# Patient Record
Sex: Female | Born: 1990
Health system: Southern US, Community
[De-identification: ages and names within clinical notes are randomized; demographics above are authoritative.]

## PROBLEM LIST (undated history)

## (undated) DIAGNOSIS — R569 Unspecified convulsions: Secondary | ICD-10-CM

## (undated) DIAGNOSIS — N83209 Unspecified ovarian cyst, unspecified side: Secondary | ICD-10-CM

## (undated) DIAGNOSIS — F1910 Other psychoactive substance abuse, uncomplicated: Secondary | ICD-10-CM

## (undated) HISTORY — PX: FEMUR FRACTURE SURGERY: SHX633

## (undated) HISTORY — PX: OVARIAN CYST SURGERY: SHX726

## (undated) HISTORY — PX: INNER EAR SURGERY: SHX679

## (undated) HISTORY — PX: DILATION AND CURETTAGE OF UTERUS: SHX78

## (undated) HISTORY — PX: APPENDECTOMY: SHX54

---

## 2012-10-08 ENCOUNTER — Emergency Department: Payer: Self-pay | Admitting: Emergency Medicine

## 2012-11-12 ENCOUNTER — Emergency Department: Payer: Self-pay | Admitting: Emergency Medicine

## 2012-11-30 ENCOUNTER — Emergency Department: Payer: Self-pay | Admitting: Emergency Medicine

## 2012-11-30 LAB — COMPREHENSIVE METABOLIC PANEL
Anion Gap: 7 (ref 7–16)
BUN: 3 mg/dL — ABNORMAL LOW (ref 7–18)
Bilirubin,Total: 0.5 mg/dL (ref 0.2–1.0)
Chloride: 103 mmol/L (ref 98–107)
Potassium: 4 mmol/L (ref 3.5–5.1)
SGPT (ALT): 22 U/L (ref 12–78)
Sodium: 138 mmol/L (ref 136–145)
Total Protein: 6.9 g/dL (ref 6.4–8.2)

## 2012-11-30 LAB — CBC
HCT: 43 % (ref 35.0–47.0)
MCH: 29.7 pg (ref 26.0–34.0)
MCHC: 33.7 g/dL (ref 32.0–36.0)
MCV: 88 fL (ref 80–100)
RBC: 4.87 10*6/uL (ref 3.80–5.20)
RDW: 12.9 % (ref 11.5–14.5)
WBC: 6.1 10*3/uL (ref 3.6–11.0)

## 2012-11-30 LAB — URINALYSIS, COMPLETE
Bilirubin,UR: NEGATIVE
Glucose,UR: NEGATIVE mg/dL (ref 0–75)
Ketone: NEGATIVE
Leukocyte Esterase: NEGATIVE
Nitrite: NEGATIVE
Protein: NEGATIVE

## 2012-12-02 LAB — BETA STREP CULTURE(ARMC)

## 2014-11-02 ENCOUNTER — Encounter (HOSPITAL_BASED_OUTPATIENT_CLINIC_OR_DEPARTMENT_OTHER): Payer: Self-pay | Admitting: *Deleted

## 2014-11-02 ENCOUNTER — Emergency Department (HOSPITAL_BASED_OUTPATIENT_CLINIC_OR_DEPARTMENT_OTHER)
Admission: EM | Admit: 2014-11-02 | Discharge: 2014-11-03 | Disposition: A | Discharge status: Home / Self Care | Payer: Self-pay | Attending: Emergency Medicine | Admitting: Emergency Medicine

## 2014-11-02 DIAGNOSIS — Z720 Tobacco use: Secondary | ICD-10-CM | POA: Insufficient documentation

## 2014-11-02 DIAGNOSIS — K029 Dental caries, unspecified: Secondary | ICD-10-CM | POA: Insufficient documentation

## 2014-11-02 MED ORDER — AMOXICILLIN 500 MG PO CAPS
500.0000 mg | ORAL_CAPSULE | Freq: Once | ORAL | Status: AC
  Administered 2014-11-03: 500 mg via ORAL
  Filled 2014-11-02: qty 1

## 2014-11-02 MED ORDER — AMOXICILLIN 500 MG PO CAPS: 500.0000 mg | ORAL_CAPSULE | Freq: Three times a day (TID) | ORAL | Status: DC

## 2014-11-02 MED ORDER — BUPIVACAINE-EPINEPHRINE (PF) 0.5% -1:200000 IJ SOLN
1.8000 mL | Freq: Once | INTRAMUSCULAR | Status: AC
  Administered 2014-11-02: 1.8 mL
  Filled 2014-11-02: qty 1.8

## 2014-11-02 MED ORDER — HYDROCODONE-ACETAMINOPHEN 5-325 MG PO TABS: ORAL_TABLET | ORAL | Status: DC

## 2014-11-02 NOTE — ED Provider Notes (Signed)
CSN: [id number]     Arrival date & time 11/02/14  2213 History   First MD Initiated Contact with Patient 11/02/14 2330     Chief Complaint  Patient presents with  . Dental Pain     (Consider location/radiation/quality/duration/timing/severity/associated sxs/prior Treatment) HPI  Caroline Cohen is a 24 y.o. female complaining of severe right lower jaw tooth pain onset 3 days ago she's been taking Motrin at home with no relief.Denies fever/chills, difficulty opening jaw, difficulty swallowing, SOB, gum swelling, facial swelling, neck swelling.    History reviewed. No pertinent past medical history. Past Surgical History  Procedure Laterality Date  . Appendectomy    . Inner ear surgery    . Femur fracture surgery     History reviewed. No pertinent family history. History  Substance Use Topics  . Smoking status: Current Every Day Smoker -- 0.50 packs/day    Types: Cigarettes  . Smokeless tobacco: Not on file  . Alcohol Use: No   OB History    No data available     Review of Systems  10 systems reviewed and found to be negative, except as noted in the HPI.   Allergies  Review of patient's allergies indicates no known allergies.  Home Medications   Prior to Admission medications   Medication Sig Start Date End Date Taking? Authorizing Provider  amoxicillin (AMOXIL) 500 MG capsule Take 1 capsule (500 mg total) by mouth 3 (three) times daily. 11/02/14   Janesia Joswick, PA-C  HYDROcodone-acetaminophen (NORCO/VICODIN) 5-325 MG per tablet Take 1-2 tablets by mouth every 6 hours as needed for pain. 11/02/14   Citlaly Camplin, PA-C   BP 114/82 mmHg  Pulse 96  Temp(Src) 98.8 F (37.1 C) (Oral)  Resp 18  SpO2 100%  LMP 10/12/2014 Physical Exam  Constitutional: She is oriented to person, place, and time. She appears well-developed and well-nourished. No distress.  HENT:  Head: Normocephalic.  Mouth/Throat:    Generally poor dentition, no gingival swelling, erythema or  tenderness to palpation. Patient is handling their secretions. There is no tenderness to palpation or firmness underneath tongue bilaterally. No trismus.    Eyes: Conjunctivae and EOM are normal.  Cardiovascular: Normal rate, regular rhythm and intact distal pulses.   Pulmonary/Chest: Effort normal and breath sounds normal. No stridor.  Musculoskeletal: Normal range of motion.  Neurological: She is alert and oriented to person, place, and time.  Psychiatric: She has a normal mood and affect.  Nursing note and vitals reviewed.   ED Course  NERVE BLOCK Date/Time: 11/02/2014 11:52 PM Performed by: Wynetta EmeryPISCIOTTA, Yerlin Gasparyan Authorized by: Wynetta EmeryPISCIOTTA, Mouna Yager Consent: Verbal consent obtained. Consent given by: patient Required items: required blood products, implants, devices, and special equipment available Patient identity confirmed: verbally with patient Indications: pain relief Body area: face/mouth Nerve: inferior alveolar Laterality: right Patient sedated: no Preparation: Patient was prepped and draped in the usual sterile fashion. Patient position: sitting Needle gauge: 27 G Anesthetic total: 1.8 ml Outcome: pain improved Patient tolerance: Patient tolerated the procedure well with no immediate complications   (including critical care time) Labs Review Labs Reviewed - No data to display  Imaging Review No results found.   EKG Interpretation None      MDM   Final diagnoses:  Pain due to dental caries    Filed Vitals:   11/02/14 2227  BP: 114/82  Pulse: 96  Temp: 98.8 F (37.1 C)  TempSrc: Oral  Resp: 18  SpO2: 100%    Medications  amoxicillin (AMOXIL) capsule 500  mg (not administered)  bupivacaine-epinephrine (MARCAINE W/ EPI) 0.5% -1:200000 injection 1.8 mL (1.8 mLs Infiltration Given by Other 11/02/14 2344)    [name] is a pleasant 24 y.o. female presenting with dental pain associated with dental caries but no signs or symptoms of dental abscess.  Patient afebrile, non toxic appearing and swallowing secretions well. I gave patient referral to dentist and stressed the importance of dental follow up for definitive management of dental issues. Patient voices understanding and is agreeable to plan.  Evaluation does not show pathology that would require ongoing emergent intervention or inpatient treatment. Pt is hemodynamically stable and mentating appropriately. Discussed findings and plan with patient/guardian, who agrees with care plan. All questions answered. Return precautions discussed and outpatient follow up given.   New Prescriptions   AMOXICILLIN (AMOXIL) 500 MG CAPSULE    Take 1 capsule (500 mg total) by mouth 3 (three) times daily.   HYDROCODONE-ACETAMINOPHEN (NORCO/VICODIN) 5-325 MG PER TABLET    Take 1-2 tablets by mouth every 6 hours as needed for pain.         Wynetta Emery, PA-C 11/02/14 2353  Vida Roller, MD 11/04/14 [phone]

## 2014-11-02 NOTE — Discharge Instructions (Signed)
Take percocet for breakthrough pain, do not drink alcohol, drive, care for children or do other critical tasks while taking percocet. ° °Return to the emergency room for fever, change in vision, redness to the face that rapidly spreads towards the eye, nausea or vomiting, difficulty swallowing or shortness of breath. °  °Apply warm compresses to jaw throughout the day.  ° ° °Take your antibiotics as directed and to the end of the course. DO NOT drink alcohol when taking metronidazole, it will make you very sick!  ° °Followup with a dentist is very important for ongoing evaluation and management of recurrent dental pain. Return to emergency department for emergent changing or worsening symptoms." ° °Low-cost dental clinic: °**David  Civils  at [phone]**  °**Janna Civils at [phone] [address]**   ° °You may also call [phone] ° °Dental Assistance °If the dentist on-call cannot see you, please use the resources below: ° ° °Patients with Medicaid: [facility] °[address], [phone] °[address], [phone] ° °If unable to pay, or uninsured, contact [facility] ([phone]) or [facility] ([phone] in [place], [phone] in [place]) to become qualified for the adult dental clinic ° °Other Low-Cost Community Dental Services: °[facility]- [address], [place], [place], [address] °   [phone], Ext. [phone] °   2nd and 4th Thursday of the month at 6:30am °   10 clients each day by appointment, can sometimes see walk-in     patients if someone does not show for an appointment °[facility]- [address], [place], [place], [address] °   [phone] °[facility]- [address], [place], [place], [address] °   [phone] ° °[facility]- [phone] °[facility]- [phone] °[facility]- [phone] ° °

## 2014-11-02 NOTE — ED Notes (Signed)
Pt reports right lower dental pain x 2 days.  Swelling noted.

## 2015-01-09 ENCOUNTER — Encounter (HOSPITAL_COMMUNITY): Payer: Self-pay | Admitting: *Deleted

## 2015-01-09 ENCOUNTER — Emergency Department (HOSPITAL_COMMUNITY)
Admission: EM | Admit: 2015-01-09 | Discharge: 2015-01-10 | Disposition: A | Discharge status: Home / Self Care | Payer: Self-pay | Attending: Emergency Medicine | Admitting: Emergency Medicine

## 2015-01-09 DIAGNOSIS — Z720 Tobacco use: Secondary | ICD-10-CM | POA: Insufficient documentation

## 2015-01-09 DIAGNOSIS — N751 Abscess of Bartholin's gland: Secondary | ICD-10-CM | POA: Insufficient documentation

## 2015-01-09 MED ORDER — DOXYCYCLINE HYCLATE 100 MG PO CAPS: 100.0000 mg | ORAL_CAPSULE | Freq: Two times a day (BID) | ORAL | Status: DC

## 2015-01-09 MED ORDER — LIDOCAINE HCL (PF) 1 % IJ SOLN
5.0000 mL | Freq: Once | INTRAMUSCULAR | Status: AC
Start: 2015-01-09 — End: 2015-01-09
  Administered 2015-01-09: 5 mL
  Filled 2015-01-09: qty 5

## 2015-01-09 MED ORDER — CEFTRIAXONE SODIUM 250 MG IJ SOLR
250.0000 mg | INTRAMUSCULAR | Status: DC
  Administered 2015-01-09: 250 mg via INTRAMUSCULAR
  Filled 2015-01-09: qty 250

## 2015-01-09 MED ORDER — LIDOCAINE HCL (PF) 1 % IJ SOLN
INTRAMUSCULAR | Status: AC
  Administered 2015-01-09: 5 mL
  Filled 2015-01-09: qty 5

## 2015-01-09 MED ORDER — TRAMADOL HCL 50 MG PO TABS
50.0000 mg | ORAL_TABLET | Freq: Four times a day (QID) | ORAL | Status: DC | PRN
Start: 2015-01-09 — End: 2015-04-22

## 2015-01-09 NOTE — ED Notes (Signed)
The pt has a growth in her rectal area    That has been there for 2 weeks.  The pt is calling it a cyst.  lmp  1-2 weeks

## 2015-01-09 NOTE — ED Notes (Addendum)
Pt states she has a "bump" under the skin in her vaginal area with some pain, states she noticed it about a week ago but felt like it was larger last night. Denies abdominal pain and states lmp was 2 weeks ago.

## 2015-01-09 NOTE — Discharge Instructions (Signed)
Bartholin's Cyst or Abscess °Bartholin's glands are small glands located within the folds of skin (labia) along the sides of the lower opening of the vagina (birth canal). A cyst may develop when the duct of the gland becomes blocked. When this happens, fluid that accumulates within the cyst can become infected. This is known as an abscess. The Bartholin gland produces a mucous fluid to lubricate the outside of the vagina during sexual intercourse. °SYMPTOMS  °· Patients with a small cyst may not have any symptoms. °· Mild discomfort to severe pain depending on the size of the cyst and if it is infected (abscess). °· Pain, redness, and swelling around the lower opening of the vagina. °· Painful intercourse. °· Pressure in the perineal area. °· Swelling of the lips of the vagina (labia). °· The cyst or abscess can be on one side or both sides of the vagina. °DIAGNOSIS  °· A large swelling is seen in the lower vagina area by your caregiver. °· Painful to touch. °· Redness and pain, if it is an abscess. °TREATMENT  °· Sometimes the cyst will go away on its own. °· Apply warm wet compresses to the area or take hot sitz baths several times a day. °· An incision to drain the cyst or abscess with local anesthesia. °· Culture the pus, if it is an abscess. °· Antibiotic treatment, if it is an abscess. °· Cut open the gland and suture the edges to make the opening of the gland bigger (marsupialization). °· Remove the whole gland if the cyst or abscess returns. °PREVENTION  °· Practice good hygiene. °· Clean the vaginal area with a mild soap and soft cloth when bathing. °· Do not rub hard in the vaginal area when bathing. °· Protect the crotch area with a padded cushion if you take long bike rides or ride horses. °· Be sure you are well lubricated when you have sexual intercourse. °HOME CARE INSTRUCTIONS  °· If your cyst or abscess was opened, a small piece of gauze, or a drain, may have been placed in the wound to allow  drainage. Do not remove this gauze or drain unless directed by your caregiver. °· Wear feminine pads, not tampons, as needed for any drainage or bleeding. °· If antibiotics were prescribed, take them exactly as directed. Finish the entire course. °· Only take over-the-counter or prescription medicines for pain, discomfort, or fever as directed by your caregiver. °SEEK IMMEDIATE MEDICAL CARE IF:  °· You have an increase in pain, redness, swelling, or drainage. °· You have bleeding from the wound which results in the use of more than the number of pads suggested by your caregiver in 24 hours. °· You have chills. °· You have a fever. °· You develop any new problems (symptoms) or aggravation of your existing condition. °MAKE SURE YOU:  °· Understand these instructions. °· Will watch your condition. °· Will get help right away if you are not doing well or get worse. °Document Released: 10/10/2005 Document Revised: 01/02/2012 Document Reviewed: 05/28/2008 °ExitCare® Patient Information ©2015 [employer]. This information is not intended to replace advice given to you by your health care provider. Make sure you discuss any questions you have with your health care provider. ° °

## 2015-01-10 NOTE — ED Provider Notes (Signed)
CSN: [id number]     Arrival date & time 01/09/15  1801 History   First MD Initiated Contact with Patient 01/09/15 2033     Chief Complaint  Patient presents with  . Abscess     (Consider location/radiation/quality/duration/timing/severity/associated sxs/prior Treatment) Patient is a 24 y.o. female presenting with abscess. The history is provided by the patient.  Abscess Location:  Ano-genital Ano-genital abscess location:  Vulva Abscess quality: fluctuance and painful   Abscess quality: not draining, no induration and no redness   Red streaking: no   Duration:  1 week Progression:  Worsening Pain details:    Quality:  Pressure and sharp   Severity:  Moderate   Duration:  1 week   Progression:  Worsening Chronicity:  New Context: not diabetes   Relieved by:  None tried Exacerbated by: walking and sitting makes worse. Ineffective treatments:  None tried Associated symptoms: no fever, no headaches, no nausea and no vomiting     History reviewed. No pertinent past medical history. Past Surgical History  Procedure Laterality Date  . Appendectomy    . Inner ear surgery    . Femur fracture surgery     No family history on file. History  Substance Use Topics  . Smoking status: Current Every Day Smoker -- 0.50 packs/day    Types: Cigarettes  . Smokeless tobacco: Not on file  . Alcohol Use: No   OB History    No data available     Review of Systems  Constitutional: Negative for fever and chills.  HENT: Negative for congestion and sore throat.   Eyes: Negative.   Respiratory: Negative for chest tightness and shortness of breath.   Cardiovascular: Negative for chest pain.  Gastrointestinal: Negative for nausea, vomiting and abdominal pain.  Genitourinary: Positive for vaginal pain. Negative for dysuria and vaginal discharge.  Musculoskeletal: Negative for joint swelling, arthralgias and neck pain.  Skin: Negative.  Negative for rash and wound.  Neurological: Negative  for dizziness, weakness, light-headedness, numbness and headaches.  Psychiatric/Behavioral: Negative.       Allergies  Review of patient's allergies indicates no known allergies.  Home Medications   Prior to Admission medications   Medication Sig Start Date End Date Taking? Authorizing Provider  amoxicillin (AMOXIL) 500 MG capsule Take 1 capsule (500 mg total) by mouth 3 (three) times daily. Patient not taking: Reported on 01/09/2015 11/02/14   Joni ReiningNicole Pisciotta, PA-C  doxycycline (VIBRAMYCIN) 100 MG capsule Take 1 capsule (100 mg total) by mouth 2 (two) times daily. 01/09/15   Burgess AmorJulie Christopher Glasscock, PA-C  HYDROcodone-acetaminophen (NORCO/VICODIN) 5-325 MG per tablet Take 1-2 tablets by mouth every 6 hours as needed for pain. Patient not taking: Reported on 01/09/2015 11/02/14   Joni ReiningNicole Pisciotta, PA-C  traMADol (ULTRAM) 50 MG tablet Take 1 tablet (50 mg total) by mouth every 6 (six) hours as needed. 01/09/15   Burgess AmorJulie Myquan Schaumburg, PA-C   BP 104/55 mmHg  Pulse 91  Temp(Src) 98.6 F (37 C) (Oral)  Resp 12  SpO2 100% Physical Exam  Constitutional: She appears well-developed and well-nourished.  HENT:  Head: Normocephalic and atraumatic.  Eyes: Conjunctivae are normal.  Neck: Normal range of motion.  Cardiovascular: Normal rate, regular rhythm, normal heart sounds and intact distal pulses.   Pulmonary/Chest: Effort normal and breath sounds normal. She has no wheezes.  Abdominal: Soft. Bowel sounds are normal. She exhibits no distension. There is no tenderness.  Genitourinary: There is tenderness on the left labia.  ttp and fluctuant edema at left  labia minora at near the posterior fourchette. No surrounding erythema or red streaking.  No drainage, no tenting.  Musculoskeletal: Normal range of motion.  Neurological: She is alert.  Skin: Skin is warm and dry.  Psychiatric: She has a normal mood and affect.  Nursing note and vitals reviewed.   ED Course  Procedures (including critical care  time)  INCISION AND DRAINAGE Performed by: Burgess Amor Consent: Verbal consent obtained. Risks and benefits: risks, benefits and alternatives were discussed Type: abscess  Body area: labia minora left  Anesthesia: local infiltration  Incision was made with a scalpel.  Local anesthetic: lidocaine 1% without epinephrine  Anesthetic total: 2 ml  Complexity: complex Blunt dissection to break up loculations  Drainage: purulent  Drainage amount: moderate, blood mixed with traces of purulence with first opening.  Copious clear mucoid liquid also expressed with significant reduction in swelling.  Packing material: na.  Patient tolerance: Patient tolerated the procedure well with no immediate complications.    Labs Review Labs Reviewed - No data to display  Imaging Review No results found.   EKG Interpretation None      MDM   Final diagnoses:  Bartholin's gland abscess    bartholins gland cyst with beginning of abscess formation.  Pt was advised to apply warm soaks bid to tid.  Tramadol and doxycycline prescribed.  Rocephin injection given to cover for possible gc/chlamydia infection. Referral to [facility] for f/u care prn persistent sx.  The patient appears reasonably screened and/or stabilized for discharge and I doubt any other medical condition or other [facility] requiring further screening, evaluation, or treatment in the ED at this time prior to discharge.     Burgess Amor, PA-C 01/10/15 0940  Richardean Canal, MD 01/10/15 [phone]

## 2015-02-22 ENCOUNTER — Encounter (HOSPITAL_COMMUNITY): Payer: Self-pay | Admitting: Emergency Medicine

## 2015-02-22 ENCOUNTER — Emergency Department (HOSPITAL_COMMUNITY)
Admission: EM | Admit: 2015-02-22 | Discharge: 2015-02-22 | Disposition: A | Discharge status: Home / Self Care | Payer: Self-pay | Attending: Emergency Medicine | Admitting: Emergency Medicine

## 2015-02-22 DIAGNOSIS — N762 Acute vulvitis: Secondary | ICD-10-CM | POA: Insufficient documentation

## 2015-02-22 DIAGNOSIS — Z792 Long term (current) use of antibiotics: Secondary | ICD-10-CM | POA: Insufficient documentation

## 2015-02-22 DIAGNOSIS — Z720 Tobacco use: Secondary | ICD-10-CM | POA: Insufficient documentation

## 2015-02-22 MED ORDER — HYDROCODONE-ACETAMINOPHEN 5-325 MG PO TABS
2.0000 | ORAL_TABLET | Freq: Once | ORAL | Status: AC
  Administered 2015-02-22: 2 via ORAL
  Filled 2015-02-22: qty 2

## 2015-02-22 MED ORDER — HYDROCODONE-ACETAMINOPHEN 5-325 MG PO TABS: 2.0000 | ORAL_TABLET | ORAL | Status: DC | PRN

## 2015-02-22 MED ORDER — CEPHALEXIN 500 MG PO CAPS: 500.0000 mg | ORAL_CAPSULE | Freq: Four times a day (QID) | ORAL | Status: DC

## 2015-02-22 MED ORDER — CEPHALEXIN 250 MG PO CAPS
500.0000 mg | ORAL_CAPSULE | Freq: Once | ORAL | Status: AC
  Administered 2015-02-22: 500 mg via ORAL
  Filled 2015-02-22: qty 2

## 2015-02-22 MED ORDER — SULFAMETHOXAZOLE-TRIMETHOPRIM 800-160 MG PO TABS
1.0000 | ORAL_TABLET | Freq: Once | ORAL | Status: AC
Start: 2015-02-22 — End: 2015-02-22
  Administered 2015-02-22: 1 via ORAL
  Filled 2015-02-22: qty 1

## 2015-02-22 MED ORDER — SULFAMETHOXAZOLE-TRIMETHOPRIM 800-160 MG PO TABS: 1.0000 | ORAL_TABLET | Freq: Two times a day (BID) | ORAL | Status: AC

## 2015-02-22 NOTE — ED Provider Notes (Signed)
CSN: [id number]     Arrival date & time 02/22/15  2046 History  This chart was scribed for non-physician provider Emilia BeckKaitlyn Ralphie Lovelady, PA-C, working with Richardean Canalavid H Yao, MD by Phillis HaggisGabriella Gaje, ED Scribe. This patient was seen in room TR11C/TR11C and patient care was started at 9:29 PM.   Chief Complaint  Patient presents with  . Abscess   Patient is a 24 y.o. female presenting with abscess. The history is provided by the patient. No language interpreter was used.  Abscess HPI Comments: Caroline Cohen is a 24 y.o. female who presents to the Emergency Department complaining of vaginal abscess. She states that she was seen one month ago for the same problem and had an I&D. She states that the abscess went away but came back and is bigger than last time. She states that the pain is a lot deeper than last time. She states that she does not have an OB/GYN that she sees. Patient states that she was unable to take the anti-biotics that she was prescribed last time because they were too expensive.     History reviewed. No pertinent past medical history. Past Surgical History  Procedure Laterality Date  . Appendectomy    . Inner ear surgery    . Femur fracture surgery     No family history on file. History  Substance Use Topics  . Smoking status: Current Every Day Smoker -- 0.50 packs/day    Types: Cigarettes  . Smokeless tobacco: Not on file  . Alcohol Use: No   OB History    No data available     Review of Systems  Skin: Positive for wound.  All other systems reviewed and are negative.  Allergies  Review of patient's allergies indicates no known allergies.  Home Medications   Prior to Admission medications   Medication Sig Start Date End Date Taking? Authorizing Provider  amoxicillin (AMOXIL) 500 MG capsule Take 1 capsule (500 mg total) by mouth 3 (three) times daily. Patient not taking: Reported on 01/09/2015 11/02/14   Joni ReiningNicole Pisciotta, PA-C  doxycycline (VIBRAMYCIN) 100 MG capsule  Take 1 capsule (100 mg total) by mouth 2 (two) times daily. 01/09/15   Burgess AmorJulie Idol, PA-C  HYDROcodone-acetaminophen (NORCO/VICODIN) 5-325 MG per tablet Take 1-2 tablets by mouth every 6 hours as needed for pain. Patient not taking: Reported on 01/09/2015 11/02/14   Joni ReiningNicole Pisciotta, PA-C  traMADol (ULTRAM) 50 MG tablet Take 1 tablet (50 mg total) by mouth every 6 (six) hours as needed. 01/09/15   Burgess AmorJulie Idol, PA-C   BP 123/77 mmHg  Pulse 88  Temp(Src) 98.3 F (36.8 C) (Oral)  Resp 18  Ht 5\' 4"  (1.626 m)  Wt 139 lb 8 oz (63.277 kg)  BMI 23.93 kg/m2  SpO2 98%  LMP 02/15/2015   Physical Exam  Constitutional: She is oriented to person, place, and time. She appears well-developed and well-nourished. No distress.  HENT:  Head: Normocephalic and atraumatic.  Mouth/Throat: Oropharynx is clear and moist.  Eyes: Conjunctivae and EOM are normal.  Neck: Normal range of motion. Neck supple.  Cardiovascular: Normal rate, regular rhythm and normal heart sounds.   Pulmonary/Chest: Effort normal and breath sounds normal. No respiratory distress.  Abdominal: Soft. She exhibits no distension. There is no tenderness. There is no rebound.  Genitourinary:  Erythema and edema of left labia majora near the perineum. No open wound or fluctuance noted. The area is tender to palpation.   Musculoskeletal: Normal range of motion. She exhibits no edema.  Neurological: She is alert and oriented to person, place, and time. No sensory deficit.  Skin: Skin is warm and dry.  Psychiatric: She has a normal mood and affect. Her behavior is normal.  Nursing note and vitals reviewed.   ED Course  Procedures (including critical care time) DIAGNOSTIC STUDIES: Oxygen Saturation is 98% on room air, normal by my interpretation.    COORDINATION OF CARE: 9:31 PM-Discussed treatment plan which includes anti-biotics and [facility] bedside and pt agreed to plan.   Labs Review Labs Reviewed - No data  to display  Imaging Review No results found.   EKG Interpretation None      MDM   Final diagnoses:  Cellulitis of labia majora   11:02 PM Patient's left labia majora cellulitic. No abscess identified. Patient will have antibiotics, pain medication, and follow up with [facility]. Vitals stable and patient afebrile.   I personally performed the services described in this documentation, which was scribed in my presence. The recorded information has been reviewed and is accurate.    Emilia Beck, PA-C 02/22/15 2304  Richardean Canal, MD 02/22/15 2330

## 2015-02-22 NOTE — Discharge Instructions (Signed)
Take bactrim and keflex as directed until gone. Take Vicodin as needed for pain. [facility].

## 2015-02-22 NOTE — ED Notes (Signed)
Pt reports abscess in vaginal area.  States she was seen here approx 1 month ago for same and had I&D.

## 2015-04-15 ENCOUNTER — Emergency Department (HOSPITAL_COMMUNITY)
Admission: EM | Admit: 2015-04-15 | Discharge: 2015-04-15 | Disposition: A | Discharge status: Home / Self Care | Payer: Self-pay | Attending: Emergency Medicine | Admitting: Emergency Medicine

## 2015-04-15 ENCOUNTER — Encounter (HOSPITAL_COMMUNITY): Payer: Self-pay | Admitting: *Deleted

## 2015-04-15 DIAGNOSIS — G8929 Other chronic pain: Secondary | ICD-10-CM | POA: Insufficient documentation

## 2015-04-15 DIAGNOSIS — M545 Low back pain, unspecified: Secondary | ICD-10-CM

## 2015-04-15 DIAGNOSIS — M62830 Muscle spasm of back: Secondary | ICD-10-CM | POA: Insufficient documentation

## 2015-04-15 DIAGNOSIS — R350 Frequency of micturition: Secondary | ICD-10-CM | POA: Insufficient documentation

## 2015-04-15 DIAGNOSIS — Z87828 Personal history of other (healed) physical injury and trauma: Secondary | ICD-10-CM | POA: Insufficient documentation

## 2015-04-15 DIAGNOSIS — R102 Pelvic and perineal pain: Secondary | ICD-10-CM | POA: Insufficient documentation

## 2015-04-15 DIAGNOSIS — Z792 Long term (current) use of antibiotics: Secondary | ICD-10-CM | POA: Insufficient documentation

## 2015-04-15 DIAGNOSIS — Z720 Tobacco use: Secondary | ICD-10-CM | POA: Insufficient documentation

## 2015-04-15 DIAGNOSIS — R1031 Right lower quadrant pain: Secondary | ICD-10-CM | POA: Insufficient documentation

## 2015-04-15 DIAGNOSIS — Z87440 Personal history of urinary (tract) infections: Secondary | ICD-10-CM | POA: Insufficient documentation

## 2015-04-15 HISTORY — DX: Unspecified convulsions: R56.9

## 2015-04-15 LAB — URINALYSIS, ROUTINE W REFLEX MICROSCOPIC
Bilirubin Urine: NEGATIVE
GLUCOSE, UA: NEGATIVE mg/dL
Hgb urine dipstick: NEGATIVE
Ketones, ur: NEGATIVE mg/dL
LEUKOCYTES UA: NEGATIVE
Nitrite: NEGATIVE
PH: 5.5 (ref 5.0–8.0)
PROTEIN: NEGATIVE mg/dL
SPECIFIC GRAVITY, URINE: 1.03 (ref 1.005–1.030)
UROBILINOGEN UA: 0.2 mg/dL (ref 0.0–1.0)

## 2015-04-15 MED ORDER — KETOROLAC TROMETHAMINE 60 MG/2ML IM SOLN
60.0000 mg | Freq: Once | INTRAMUSCULAR | Status: AC
  Administered 2015-04-15: 60 mg via INTRAMUSCULAR
  Filled 2015-04-15: qty 2

## 2015-04-15 MED ORDER — METHOCARBAMOL 500 MG PO TABS: 500.0000 mg | ORAL_TABLET | Freq: Two times a day (BID) | ORAL | Status: DC

## 2015-04-15 MED ORDER — DIAZEPAM 2 MG PO TABS
2.0000 mg | ORAL_TABLET | Freq: Once | ORAL | Status: AC
  Administered 2015-04-15: 2 mg via ORAL
  Filled 2015-04-15: qty 1

## 2015-04-15 MED ORDER — IBUPROFEN 800 MG PO TABS: 800.0000 mg | ORAL_TABLET | Freq: Three times a day (TID) | ORAL | Status: DC

## 2015-04-15 NOTE — ED Notes (Signed)
Pt presents via POV c/o lower back pain x 3 days.  [place] and thinks she pulled something.  Pt a x 4, NAD.

## 2015-04-15 NOTE — Discharge Instructions (Signed)
Back Pain, Adult °Low back pain is very common. About 1 in 5 people have back pain. The cause of low back pain is rarely dangerous. The pain often gets better over time. About half of people with a sudden onset of back pain feel better in just 2 weeks. About 8 in 10 people feel better by 6 weeks.  °CAUSES °Some common causes of back pain include: °· Strain of the muscles or ligaments supporting the spine. °· Wear and tear (degeneration) of the spinal discs. °· Arthritis. °· Direct injury to the back. °DIAGNOSIS °Most of the time, the direct cause of low back pain is not known. However, back pain can be treated effectively even when the exact cause of the pain is unknown. Answering your caregiver's questions about your overall health and symptoms is one of the most accurate ways to make sure the cause of your pain is not dangerous. If your caregiver needs more information, he or she may order lab work or imaging tests (X-rays or MRIs). However, even if imaging tests show changes in your back, this usually does not require surgery. °HOME CARE INSTRUCTIONS °For many people, back pain returns. Since low back pain is rarely dangerous, it is often a condition that people can learn to manage on their own.  °· Remain active. It is stressful on the back to sit or stand in one place. Do not sit, drive, or stand in one place for more than 30 minutes at a time. Take short walks on level surfaces as soon as pain allows. Try to increase the length of time you walk each day. °· Do not stay in bed. Resting more than 1 or 2 days can delay your recovery. °· Do not avoid exercise or work. Your body is made to move. It is not dangerous to be active, even though your back may hurt. Your back will likely heal faster if you return to being active before your pain is gone. °· Pay attention to your body when you  bend and lift. Many people have less discomfort when lifting if they bend their knees, keep the load close to their bodies, and  avoid twisting. Often, the most comfortable positions are those that put less stress on your recovering back. °· Find a comfortable position to sleep. Use a firm mattress and lie on your side with your knees slightly bent. If you lie on your back, put a pillow under your knees. °· Only take over-the-counter or prescription medicines as directed by your caregiver. Over-the-counter medicines to reduce pain and inflammation are often the most helpful. Your caregiver may prescribe muscle relaxant drugs. These medicines help dull your pain so you can more quickly return to your normal activities and healthy exercise. °· Put ice on the injured area. °· Put ice in a plastic bag. °· Place a towel between your skin and the bag. °· Leave the ice on for 15-20 minutes, 03-04 times a day for the first 2 to 3 days. After that, ice and heat may be alternated to reduce pain and spasms. °· Ask your caregiver about trying back exercises and gentle massage. This may be of some benefit. °· Avoid feeling anxious or stressed. Stress increases muscle tension and can worsen back pain. It is important to recognize when you are anxious or stressed and learn ways to manage it. Exercise is a great option. °SEEK MEDICAL CARE IF: °· You have pain that is not relieved with rest or medicine. °· You have pain that does not improve in 1 week. °· You have new symptoms. °· You are generally not feeling well. °SEEK   IMMEDIATE MEDICAL CARE IF:  °· You have pain that radiates from your back into your legs. °· You develop new bowel or bladder control problems. °· You have unusual weakness or numbness in your arms or legs. °· You develop nausea or vomiting. °· You develop abdominal pain. °· You feel faint. °Document Released: 10/10/2005 Document Revised: 04/10/2012 Document Reviewed: 02/11/2014 °ExitCare® Patient Information ©2015 [employer]. This information is not intended to replace advice given to you by your health care provider. Make sure you  discuss any questions you have with your health care provider. ° °Chronic Back Pain ° When back pain lasts longer than 3 months, it is called chronic back pain. People with chronic back pain often go through certain periods that are more intense (flare-ups).  °CAUSES °Chronic back pain can be caused by wear and tear (degeneration) on different structures in your back. These structures include: °· The bones of your spine (vertebrae) and the joints surrounding your spinal cord and nerve roots (facets). °· The strong, fibrous tissues that connect your vertebrae (ligaments). °Degeneration of these structures may result in pressure on your nerves. This can lead to constant pain. °HOME CARE INSTRUCTIONS °· Avoid bending, heavy lifting, prolonged sitting, and activities which make the problem worse. °· Take brief periods of rest throughout the day to reduce your pain. Lying down or standing usually is better than sitting while you are resting. °· Take over-the-counter or prescription medicines only as directed by your caregiver. °SEEK IMMEDIATE MEDICAL CARE IF:  °· You have weakness or numbness in one of your legs or feet. °· You have trouble controlling your bladder or bowels. °· You have nausea, vomiting, abdominal pain, shortness of breath, or fainting. °Document Released: 11/17/2004 Document Revised: 01/02/2012 Document Reviewed: 09/24/2011 °ExitCare® Patient Information ©2015 [employer]. This information is not intended to replace advice given to you by your health care provider. Make sure you discuss any questions you have with your health care provider. ° °
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
## 2015-04-22 ENCOUNTER — Encounter (HOSPITAL_BASED_OUTPATIENT_CLINIC_OR_DEPARTMENT_OTHER): Payer: Self-pay

## 2015-04-22 ENCOUNTER — Emergency Department (HOSPITAL_BASED_OUTPATIENT_CLINIC_OR_DEPARTMENT_OTHER)
Admission: EM | Admit: 2015-04-22 | Discharge: 2015-04-22 | Disposition: A | Discharge status: Home / Self Care | Payer: Self-pay | Attending: Emergency Medicine | Admitting: Emergency Medicine

## 2015-04-22 DIAGNOSIS — Z720 Tobacco use: Secondary | ICD-10-CM | POA: Insufficient documentation

## 2015-04-22 DIAGNOSIS — Z8669 Personal history of other diseases of the nervous system and sense organs: Secondary | ICD-10-CM | POA: Insufficient documentation

## 2015-04-22 DIAGNOSIS — G8911 Acute pain due to trauma: Secondary | ICD-10-CM | POA: Insufficient documentation

## 2015-04-22 DIAGNOSIS — Z3202 Encounter for pregnancy test, result negative: Secondary | ICD-10-CM | POA: Insufficient documentation

## 2015-04-22 DIAGNOSIS — R300 Dysuria: Secondary | ICD-10-CM | POA: Insufficient documentation

## 2015-04-22 DIAGNOSIS — M545 Low back pain: Secondary | ICD-10-CM | POA: Insufficient documentation

## 2015-04-22 LAB — URINALYSIS, ROUTINE W REFLEX MICROSCOPIC
BILIRUBIN URINE: NEGATIVE
GLUCOSE, UA: NEGATIVE mg/dL
Hgb urine dipstick: NEGATIVE
KETONES UR: NEGATIVE mg/dL
Leukocytes, UA: NEGATIVE
NITRITE: NEGATIVE
PROTEIN: NEGATIVE mg/dL
SPECIFIC GRAVITY, URINE: 1.024 (ref 1.005–1.030)
UROBILINOGEN UA: 1 mg/dL (ref 0.0–1.0)
pH: 6 (ref 5.0–8.0)

## 2015-04-22 LAB — PREGNANCY, URINE: Preg Test, Ur: NEGATIVE

## 2015-04-22 MED ORDER — IBUPROFEN 800 MG PO TABS
800.0000 mg | ORAL_TABLET | Freq: Once | ORAL | Status: AC
  Administered 2015-04-22: 800 mg via ORAL
  Filled 2015-04-22: qty 1

## 2015-04-22 MED ORDER — NAPROXEN 500 MG PO TABS: 500.0000 mg | ORAL_TABLET | Freq: Two times a day (BID) | ORAL | Status: DC

## 2015-04-22 NOTE — ED Notes (Signed)
Pt c/o rt flank pain, which radiates to rt groin area. States been having burning upon urination. Onset approx 2 weeks ago.

## 2015-04-22 NOTE — ED Provider Notes (Signed)
CSN: [id number]     Arrival date & time 04/22/15  1306 History   First MD Initiated Contact with Patient 04/22/15 1318     Chief Complaint  Patient presents with  . Back Pain     (Consider location/radiation/quality/duration/timing/severity/associated sxs/prior Treatment) HPI Comments: Patient states 2 weeks of low back pain that onset after she was [place] 2 [place].  Pain is constant and worse, nothing makes it better.  She was seen in the ED on 6/22 and diagnosed with a lumbar strain.  She was prescribed robaxin which she didn't feel due to cost.  No focal weakness, numbness, tingling, bowel or bladder incontinence.  Also with  Urinary frequency.  No hematuria.  Pain no radiates around R side which it didn't do last week.  No fever, chills, nausea, vomiting, chest pain, SOB.  No vaginal symptoms.  Patient is a 24 y.o. female presenting with back pain. The history is provided by the patient.  Back Pain Associated symptoms: dysuria   Associated symptoms: no abdominal pain, no chest pain, no fever, no pelvic pain and no weakness     Past Medical History  Diagnosis Date  . Seizures    Past Surgical History  Procedure Laterality Date  . Appendectomy    . Inner ear surgery    . Femur fracture surgery     No family history on file. History  Substance Use Topics  . Smoking status: Current Every Day Smoker -- 0.50 packs/day    Types: Cigarettes  . Smokeless tobacco: Not on file  . Alcohol Use: No   OB History    No data available     Review of Systems  Constitutional: Negative for fever, activity change and appetite change.  HENT: Negative for congestion and rhinorrhea.   Respiratory: Negative for cough, chest tightness and shortness of breath.   Cardiovascular: Negative for chest pain and leg swelling.  Gastrointestinal: Negative for nausea, vomiting, abdominal pain and diarrhea.  Genitourinary: Positive for dysuria. Negative for  hematuria, vaginal bleeding, vaginal discharge and pelvic pain.  Musculoskeletal: Positive for myalgias, back pain and arthralgias.  Skin: Negative for rash.  Neurological: Negative for dizziness, syncope, speech difficulty and weakness.   A complete 10 system review of systems was obtained and all systems are negative except as noted in the HPI and PMH.     Allergies  Flexeril  Home Medications   Prior to Admission medications   Medication Sig Start Date End Date Taking? Authorizing Provider  naproxen (NAPROSYN) 500 MG tablet Take 1 tablet (500 mg total) by mouth 2 (two) times daily. 04/22/15   Glynn OctaveStephen Keyosha Tiedt, MD   BP 108/69 mmHg  Pulse 89  Temp(Src) 98.2 F (36.8 C) (Oral)  Resp 16  Ht 5\' 3"  (1.6 m)  Wt 139 lb (63.05 kg)  BMI 24.63 kg/m2  SpO2 99%  LMP 04/06/2015 Physical Exam  Constitutional: She is oriented to person, place, and time. She appears well-developed and well-nourished. No distress.  HENT:  Head: Normocephalic and atraumatic.  Mouth/Throat: Oropharynx is clear and moist. No oropharyngeal exudate.  Eyes: Conjunctivae and EOM are normal. Pupils are equal, round, and reactive to light.  Neck: Normal range of motion. Neck supple.  No meningismus.  Cardiovascular: Normal rate, regular rhythm, normal heart sounds and intact distal pulses.   No murmur heard. Pulmonary/Chest: Effort normal and breath sounds normal. No respiratory distress.  Abdominal: Soft. There is no tenderness. There is no rebound  and no guarding.  Musculoskeletal: Normal range of motion. She exhibits tenderness. She exhibits no edema.  R paraspinal lumbar pain.  No midline tenderness  5/5 strength in bilateral lower extremities. Ankle plantar and dorsiflexion intact. Great toe extension intact bilaterally. +2 DP and PT pulses. +2 patellar reflexes bilaterally. Normal gait.   Neurological: She is alert and oriented to person, place, and time. No cranial nerve deficit. She exhibits normal muscle  tone. Coordination normal.  No ataxia on finger to nose bilaterally. No pronator drift. 5/5 strength throughout. CN 2-12 intact. Negative Romberg. Equal grip strength. Sensation intact. Gait is normal.   Skin: Skin is warm.  Psychiatric: She has a normal mood and affect. Her behavior is normal.  Nursing note and vitals reviewed.   ED Course  Procedures (including critical care time) Labs Review Labs Reviewed  URINALYSIS, ROUTINE W REFLEX MICROSCOPIC (NOT AT [facility]) - Abnormal; Notable for the following:    APPearance CLOUDY (*)    All other components within normal limits  PREGNANCY, URINE    Imaging Review No results found.   EKG Interpretation None      MDM   Final diagnoses:  Low back pain without sciatica, unspecified back pain laterality   patient with 2 weeks of constant right-sided low back pain onset after lifting children. Also with dysuria. Seen at [facility] one week ago. No fever. No focal weakness, numbness or tingling. No incontinence.  Neurovascularly intact. No evidence of cord compression or cauda equina. Urinalysis is negative. Pregnancy test is negative.   pain improved with treatment in the ED. Urinalysis is negative for infection or blood. Low suspicion for kidney stone. We'll defer CT at this time.   Patient ambulatory. No neurological deficits. We'll treat supportively with anti-inflammatory. Patient requesting narcotics which are not provided at this time. Follow-up with PCP. Return precautions discussed.  BP 108/69 mmHg  Pulse 89  Temp(Src) 98.2 F (36.8 C) (Oral)  Resp 16  Ht  (1.6 m)  Wt 139 lb (63.05 kg)  BMI 24.63 kg/m2  SpO2 99%  LMP 04/06/2015     Glynn Octave, MD 04/22/15 1525

## 2015-04-22 NOTE — ED Notes (Addendum)
C/o right side lower back pain x 2 weeks-pain started after lifting children-states she is also having dysuria x 4 days-[facility] for same back pain-states dysuria started since ED visit

## 2015-04-22 NOTE — Discharge Instructions (Signed)

## 2015-06-19 ENCOUNTER — Encounter (HOSPITAL_BASED_OUTPATIENT_CLINIC_OR_DEPARTMENT_OTHER): Payer: Self-pay | Admitting: *Deleted

## 2015-06-19 ENCOUNTER — Emergency Department (HOSPITAL_BASED_OUTPATIENT_CLINIC_OR_DEPARTMENT_OTHER)
Admission: EM | Admit: 2015-06-19 | Discharge: 2015-06-19 | Disposition: A | Discharge status: Home / Self Care | Payer: Self-pay | Attending: Emergency Medicine | Admitting: Emergency Medicine

## 2015-06-19 DIAGNOSIS — Z720 Tobacco use: Secondary | ICD-10-CM | POA: Insufficient documentation

## 2015-06-19 DIAGNOSIS — K047 Periapical abscess without sinus: Secondary | ICD-10-CM | POA: Insufficient documentation

## 2015-06-19 DIAGNOSIS — R000 Tachycardia, unspecified: Secondary | ICD-10-CM | POA: Insufficient documentation

## 2015-06-19 DIAGNOSIS — K088 Other specified disorders of teeth and supporting structures: Secondary | ICD-10-CM | POA: Insufficient documentation

## 2015-06-19 DIAGNOSIS — K029 Dental caries, unspecified: Secondary | ICD-10-CM | POA: Insufficient documentation

## 2015-06-19 DIAGNOSIS — Z791 Long term (current) use of non-steroidal anti-inflammatories (NSAID): Secondary | ICD-10-CM | POA: Insufficient documentation

## 2015-06-19 MED ORDER — AMOXICILLIN 500 MG PO CAPS: 500.0000 mg | ORAL_CAPSULE | Freq: Three times a day (TID) | ORAL | Status: DC

## 2015-06-19 NOTE — ED Notes (Signed)
PA at bedside.

## 2015-06-19 NOTE — Discharge Instructions (Signed)
Take amoxicillin as directed. It is important to take the entire course of this antibiotic. Follow-up with the dentist. Take Tylenol or ibuprofen as needed for pain. Follow up with the dentist.  Dental Care and Dentist Visits Dental care supports good overall health. Regular dental visits can also help you avoid dental pain, bleeding, infection, and other more serious health problems in the future. It is important to keep the mouth healthy because diseases in the teeth, gums, and other oral tissues can spread to other areas of the body. Some problems, such as diabetes, heart disease, and pre-term labor have been associated with poor oral health.  See your dentist every 6 months. If you experience emergency problems such as a toothache or broken tooth, go to the dentist right away. If you see your dentist regularly, you may catch problems early. It is easier to be treated for problems in the early stages.  WHAT TO EXPECT AT A DENTIST VISIT  Your dentist will look for many common oral health problems and recommend proper treatment. At your regular dental visit, you can expect:  Gentle cleaning of the teeth and gums. This includes scraping and polishing. This helps to remove the sticky substance around the teeth and gums (plaque). Plaque forms in the mouth shortly after eating. Over time, plaque hardens on the teeth as tartar. If tartar is not removed regularly, it can cause problems. Cleaning also helps remove stains.  Periodic X-rays. These pictures of the teeth and supporting bone will help your dentist assess the health of your teeth.  Periodic fluoride treatments. Fluoride is a natural mineral shown to help strengthen teeth. Fluoride treatmentinvolves applying a fluoride gel or varnish to the teeth. It is most commonly done in children.  Examination of the mouth, tongue, jaws, teeth, and gums to look for any oral health problems, such as:  Cavities (dental caries). This is decay on the tooth  caused by plaque, sugar, and acid in the mouth. It is best to catch a cavity when it is small.  Inflammation of the gums caused by plaque buildup (gingivitis).  Problems with the mouth or malformed or misaligned teeth.  Oral cancer or other diseases of the soft tissues or jaws. KEEP YOUR TEETH AND GUMS HEALTHY For healthy teeth and gums, follow these general guidelines as well as your dentist's specific advice:  Have your teeth professionally cleaned at the dentist every 6 months.  Brush twice daily with a fluoride toothpaste.  Floss your teeth daily.  Ask your dentist if you need fluoride supplements, treatments, or fluoride toothpaste.  Eat a healthy diet. Reduce foods and drinks with added sugar.  Avoid smoking. TREATMENT FOR ORAL HEALTH PROBLEMS If you have oral health problems, treatment varies depending on the conditions present in your teeth and gums.  Your caregiver will most likely recommend good oral hygiene at each visit.  For cavities, gingivitis, or other oral health disease, your caregiver will perform a procedure to treat the problem. This is typically done at a separate appointment. Sometimes your caregiver will refer you to another dental specialist for specific tooth problems or for surgery. SEEK IMMEDIATE DENTAL CARE IF:  You have pain, bleeding, or soreness in the gum, tooth, jaw, or mouth area.  A permanent tooth becomes loose or separated from the gum socket.  You experience a blow or injury to the mouth or jaw area. Document Released: 06/22/2011 Document Revised: 01/02/2012 Document Reviewed: 06/22/2011 [facility] Patient Information 2015 [place], [place]. This information is not intended  to replace advice given to you by your health care provider. Make sure you discuss any questions you have with your health care provider.  Dental Pain A tooth ache may be caused by cavities (tooth decay). Cavities expose the nerve of the tooth to air and hot or cold  temperatures. It may come from an infection or abscess (also called a boil or furuncle) around your tooth. It is also often caused by dental caries (tooth decay). This causes the pain you are having. DIAGNOSIS  Your caregiver can diagnose this problem by exam. TREATMENT   If caused by an infection, it may be treated with medications which kill germs (antibiotics) and pain medications as prescribed by your caregiver. Take medications as directed.  Only take over-the-counter or prescription medicines for pain, discomfort, or fever as directed by your caregiver.  Whether the tooth ache today is caused by infection or dental disease, you should see your dentist as soon as possible for further care. SEEK MEDICAL CARE IF: The exam and treatment you received today has been provided on an emergency basis only. This is not a substitute for complete medical or dental care. If your problem worsens or new problems (symptoms) appear, and you are unable to meet with your dentist, call or return to this location. SEEK IMMEDIATE MEDICAL CARE IF:   You have a fever.  You develop redness and swelling of your face, jaw, or neck.  You are unable to open your mouth.  You have severe pain uncontrolled by pain medicine. MAKE SURE YOU:   Understand these instructions.  Will watch your condition.  Will get help right away if you are not doing well or get worse. Document Released: 10/10/2005 Document Revised: 01/02/2012 Document Reviewed: 05/28/2008 [facility] Patient Information 2015 [place], [place]. This information is not intended to replace advice given to you by your health care provider. Make sure you discuss any questions you have with your health care provider.  Dental Caries Dental caries (also called tooth decay) is the most common oral disease. It can occur at any age but is more common in children and young adults.  HOW DENTAL CARIES DEVELOPS  The process of decay begins when bacteria and foods  (particularly sugars and starches) combine in your mouth to produce plaque. Plaque is a substance that sticks to the hard, outer surface of a tooth (enamel). The bacteria in plaque produce acids that attack enamel. These acids may also attack the root surface of a tooth (cementum) if it is exposed. Repeated attacks dissolve these surfaces and create holes in the tooth (cavities). If left untreated, the acids destroy the other layers of the tooth.  RISK FACTORS  Frequent sipping of sugary beverages.   Frequent snacking on sugary and starchy foods, especially those that easily get stuck in the teeth.   Poor oral hygiene.   Dry mouth.   Substance abuse such as methamphetamine abuse.   Broken or poor-fitting dental restorations.   Eating disorders.   Gastroesophageal reflux disease (GERD).   Certain radiation treatments to the head and neck. SYMPTOMS In the early stages of dental caries, symptoms are seldom present. Sometimes white, chalky areas may be seen on the enamel or other tooth layers. In later stages, symptoms may include:  Pits and holes on the enamel.  Toothache after sweet, hot, or cold foods or drinks are consumed.  Pain around the tooth.  Swelling around the tooth. DIAGNOSIS  Most of the time, dental caries is detected during a regular  dental checkup. A diagnosis is made after a thorough medical and dental history is taken and the surfaces of your teeth are checked for signs of dental caries. Sometimes special instruments, such as lasers, are used to check for dental caries. Dental X-ray exams may be taken so that areas not visible to the eye (such as between the contact areas of the teeth) can be checked for cavities.  TREATMENT  If dental caries is in its early stages, it may be reversed with a fluoride treatment or an application of a remineralizing agent at the dental office. Thorough brushing and flossing at home is needed to aid these treatments. If it is in  its later stages, treatment depends on the location and extent of tooth destruction:   If a small area of the tooth has been destroyed, the destroyed area will be removed and cavities will be filled with a material such as gold, silver amalgam, or composite resin.   If a large area of the tooth has been destroyed, the destroyed area will be removed and a cap (crown) will be fitted over the remaining tooth structure.   If the center part of the tooth (pulp) is affected, a procedure called a root canal will be needed before a filling or crown can be placed.   If most of the tooth has been destroyed, the tooth may need to be pulled (extracted). HOME CARE INSTRUCTIONS You can prevent, stop, or reverse dental caries at home by practicing good oral hygiene. Good oral hygiene includes:  Thoroughly cleaning your teeth at least twice a day with a toothbrush and dental floss.   Using a fluoride toothpaste. A fluoride mouth rinse may also be used if recommended by your dentist or health care provider.   Restricting the amount of sugary and starchy foods and sugary liquids you consume.   Avoiding frequent snacking on these foods and sipping of these liquids.   Keeping regular visits with a dentist for checkups and cleanings. PREVENTION   Practice good oral hygiene.  Consider a dental sealant. A dental sealant is a coating material that is applied by your dentist to the pits and grooves of teeth. The sealant prevents food from being trapped in them. It may protect the teeth for several years.  Ask about fluoride supplements if you live in a community without fluorinated water or with water that has a low fluoride content. Use fluoride supplements as directed by your dentist or health care provider.  Allow fluoride varnish applications to teeth if directed by your dentist or health care provider. Document Released: 07/02/2002 Document Revised: 02/24/2014 Document Reviewed:  10/12/2012 [facility] Patient Information 2015 [place], [place]. This information is not intended to replace advice given to you by your health care provider. Make sure you discuss any questions you have with your health care provider.

## 2015-06-19 NOTE — ED Notes (Signed)
Dental pain. Left side of her face has been swollen. She was seen at another facility on Monday and given PCN. States she has been taking the medication with no improvement.

## 2015-06-19 NOTE — ED Provider Notes (Signed)
CSN: [id number]     Arrival date & time 06/19/15  2027 History   First MD Initiated Contact with Patient 06/19/15 2033     Chief Complaint  Patient presents with  . Dental Pain     (Consider location/radiation/quality/duration/timing/severity/associated sxs/prior Treatment) HPI Comments: 24 year old female presenting with left upper dental pain 1 month, worsening over the past 5 days. She was seen 5 days ago at [facility] in prescribed amoxicillin along with tramadol. She did not believe she could afford the medication so she threw it away. The pain has continued and is radiating towards her left ear. Her face feels swollen on the left. Pain worse with chewing rated 9/10. Denies fevers or trouble swallowing. Does not have a dentist and is moving to [place] next week.  Patient is a 24 y.o. female presenting with tooth pain. The history is provided by the patient.  Dental Pain Associated symptoms: facial swelling     Past Medical History  Diagnosis Date  . Seizures    Past Surgical History  Procedure Laterality Date  . Appendectomy    . Inner ear surgery    . Femur fracture surgery     No family history on file. Social History  Substance Use Topics  . Smoking status: Current Every Day Smoker -- 0.50 packs/day    Types: Cigarettes  . Smokeless tobacco: None  . Alcohol Use: No   OB History    No data available     Review of Systems  HENT: Positive for dental problem and facial swelling.   All other systems reviewed and are negative.     Allergies  Flexeril  Home Medications   Prior to Admission medications   Medication Sig Start Date End Date Taking? Authorizing Provider  amoxicillin (AMOXIL) 500 MG capsule Take 1 capsule (500 mg total) by mouth 3 (three) times daily. 06/19/15   Kathrynn Speed, PA-C  naproxen (NAPROSYN) 500 MG tablet Take 1 tablet (500 mg total) by mouth 2 (two) times daily. 04/22/15   Glynn Octave, MD   BP 122/67 mmHg  Pulse 109   Temp(Src) 98.2 F (36.8 C) (Oral)  Resp 16  Ht  (1.575 m)  Wt 135 lb (61.236 kg)  BMI 24.69 kg/m2  SpO2 100%  LMP 06/12/2015 Physical Exam  Constitutional: She is oriented to person, place, and time. She appears well-developed and well-nourished. No distress.  HENT:  Head: Normocephalic and atraumatic.  Mouth/Throat: Oropharynx is clear and moist. Abnormal dentition. Dental caries present.    Tooth decay and dental carries around left posterior molars with gingival inflammation. No dental abscess. No facial swelling.  Eyes: Conjunctivae and EOM are normal.  Neck: Normal range of motion. Neck supple.  Cardiovascular: Regular rhythm and normal heart sounds.   Mild tachy.  Pulmonary/Chest: Effort normal and breath sounds normal. No respiratory distress.  Musculoskeletal: Normal range of motion. She exhibits no edema.  Neurological: She is alert and oriented to person, place, and time. No sensory deficit.  Skin: Skin is warm and dry.  Psychiatric: She has a normal mood and affect. Her behavior is normal.  Nursing note and vitals reviewed.   ED Course  Procedures (including critical care time) Labs Review Labs Reviewed - No data to display  Imaging Review No results found. I have personally reviewed and evaluated these images and lab results as part of my medical decision-making.   EKG Interpretation None      MDM   Final diagnoses:  Dental infection  Pain due to dental caries   Dental infection without dental abscess. No signs of Ludwig angina. Swallows secretions well. Stressed importance of completing the course of antibiotics and advised her that it is on the free antibiotics listed [facility]. It is also on the $4 list at [facility]. Advised Tylenol or ibuprofen for pain. Resources given for dental follow-up. Stable for discharge. Return precautions given. Patient states understanding of treatment care plan and is agreeable.   Kathrynn Speed, PA-C 06/19/15  2059  Tilden Fossa, MD 06/19/15 2227

## 2015-09-09 ENCOUNTER — Encounter (HOSPITAL_BASED_OUTPATIENT_CLINIC_OR_DEPARTMENT_OTHER): Payer: Self-pay

## 2015-09-09 ENCOUNTER — Emergency Department (HOSPITAL_BASED_OUTPATIENT_CLINIC_OR_DEPARTMENT_OTHER)
Admission: EM | Admit: 2015-09-09 | Discharge: 2015-09-09 | Disposition: A | Discharge status: Home / Self Care | Payer: Self-pay | Attending: Emergency Medicine | Admitting: Emergency Medicine

## 2015-09-09 DIAGNOSIS — F17210 Nicotine dependence, cigarettes, uncomplicated: Secondary | ICD-10-CM | POA: Insufficient documentation

## 2015-09-09 DIAGNOSIS — K047 Periapical abscess without sinus: Secondary | ICD-10-CM

## 2015-09-09 DIAGNOSIS — Z792 Long term (current) use of antibiotics: Secondary | ICD-10-CM | POA: Insufficient documentation

## 2015-09-09 DIAGNOSIS — K029 Dental caries, unspecified: Secondary | ICD-10-CM | POA: Insufficient documentation

## 2015-09-09 MED ORDER — LIDOCAINE VISCOUS 2 % MT SOLN: OROMUCOSAL | Status: DC

## 2015-09-09 MED ORDER — NAPROXEN 500 MG PO TABS: 500.0000 mg | ORAL_TABLET | Freq: Two times a day (BID) | ORAL | Status: DC

## 2015-09-09 MED ORDER — CLINDAMYCIN HCL 150 MG PO CAPS: 300.0000 mg | ORAL_CAPSULE | Freq: Three times a day (TID) | ORAL | Status: DC

## 2015-09-09 NOTE — ED Provider Notes (Signed)
CSN: [id number]     Arrival date & time 09/09/15  1440 History   First MD Initiated Contact with Patient 09/09/15 1523     Chief Complaint  Patient presents with  . Dental Pain     (Consider location/radiation/quality/duration/timing/severity/associated sxs/prior Treatment) Patient is a 24 y.o. female presenting with tooth pain. The history is provided by the patient and medical records.  Dental Pain   24 year old female with history of seizures, presenting to the ED for right upper dental pain. Patient states she is having issues with her teeth for the past several months. She states she has been trying to get dental insurance, however has been unsuccessful. She declined a dental clinic that she will follow-up with next week. She is currently on amoxicillin, but states is not helping. She states she took clindamycin in the past which seemed to work better. She states her teeth are very painful making it difficult for her to eat. She states she has had some vomiting which she thinks is due to taking the medication on an empty stomach. She denies fever or chills. She denies any difficulty swallowing or handling her secretions.    Past Medical History  Diagnosis Date  . Seizures [facility])    Past Surgical History  Procedure Laterality Date  . Appendectomy    . Inner ear surgery    . Femur fracture surgery     No family history on file. Social History  Substance Use Topics  . Smoking status: Current Every Day Smoker -- 0.50 packs/day    Types: Cigarettes  . Smokeless tobacco: None  . Alcohol Use: No   OB History    No data available     Review of Systems  HENT: Positive for dental problem.   All other systems reviewed and are negative.     Allergies  Flexeril  Home Medications   Prior to Admission medications   Medication Sig Start Date End Date Taking? Authorizing Provider  amoxicillin (AMOXIL) 500 MG capsule Take 1 capsule (500 mg total) by mouth 3 (three) times daily.  06/19/15   Robyn M Hess, PA-C   BP 114/75 mmHg  Pulse 117  Temp(Src) 99.1 F (37.3 C) (Oral)  Resp 18  Ht  (1.626 m)  Wt 135 lb (61.236 kg)  BMI 23.16 kg/m2  SpO2 100%  LMP 08/26/2015   Physical Exam  Constitutional: She is oriented to person, place, and time. She appears well-developed and well-nourished. No distress.  HENT:  Head: Normocephalic and atraumatic.  Mouth/Throat: Uvula is midline, oropharynx is clear and moist and mucous membranes are normal. Abnormal dentition. Dental abscesses and dental caries present. No oropharyngeal exudate, posterior oropharyngeal edema or posterior oropharyngeal erythema.    Teeth largely in poor dentition, right upper central incisor broken with correlating partially erupted supernumerary tooth above it, surrounding gingiva swollen with small dental abscess noted, no fluctuance or drainage, handling secretions appropriately, no trismus; mild swelling of right upper lip; no cheek or neck swelling  Eyes: Conjunctivae and EOM are normal. Pupils are equal, round, and reactive to light.  Neck: Normal range of motion. Neck supple.  Cardiovascular: Normal rate, regular rhythm and normal heart sounds.   Pulmonary/Chest: Effort normal and breath sounds normal. No respiratory distress. She has no wheezes.  Musculoskeletal: Normal range of motion.  Neurological: She is alert and oriented to person, place, and time.  Skin: Skin is warm and dry. She is not diaphoretic.  Psychiatric: She has a normal mood and affect.  Nursing note and vitals reviewed.   ED Course  Procedures (including critical care time) Labs Review Labs Reviewed - No data to display  Imaging Review No results found. I have personally reviewed and evaluated these images and lab results as part of my medical decision-making.   EKG Interpretation None      MDM   Final diagnoses:  Dental abscess   24 year old female here with right upper dental pain. Her right upper  central incisor is broken and has a partially erupted supernumerary tooth above it. There is evidence of small dental abscess, no fluctuance or drainable fluid pockets at this time. She has mild swelling of her upper lip without cheek or neck swelling. She is handling her secretions well, normal phonation without stridor.  Do not suspect ludwig's angina at this time.  Patient has been taking antibiotics without improvement, will change back to clindamycin as she has tolerated this well the past. Rx Naprosyn and lidocaine for pain. She has dental follow-up scheduled for next week.  Discussed plan with patient, he/she acknowledged understanding and agreed with plan of care.  Return precautions given for new or worsening symptoms.  Garlon HatchetLisa M Jackie Littlejohn, PA-C 09/09/15 1604  Lyndal Pulleyaniel Knott, MD 09/10/15 1250

## 2015-09-09 NOTE — ED Notes (Addendum)
C/o right upper toothache x "months"-swelling noted to right side of face-[facility] with abx x 2 rounds-is currently taking amoxil-has appt with dentist next Thursday

## 2015-09-09 NOTE — Discharge Instructions (Signed)
Take the prescribed medication as directed. Follow-up with your dentist next week as scheduled. Return to the ED for new or worsening symptoms.  Dental Abscess A dental abscess is a collection of pus in or around a tooth. CAUSES This condition is caused by a bacterial infection around the root of the tooth that involves the inner part of the tooth (pulp). It may result from:  Severe tooth decay.  Trauma to the tooth that allows bacteria to enter into the pulp, such as a broken or chipped tooth.  Severe gum disease around a tooth. SYMPTOMS Symptoms of this condition include:  Severe pain in and around the infected tooth.  Swelling and redness around the infected tooth, in the mouth, or in the face.  Tenderness.  Pus drainage.  Bad breath.  Bitter taste in the mouth.  Difficulty swallowing.  Difficulty opening the mouth.  Nausea.  Vomiting.  Chills.  Swollen neck glands.  Fever. DIAGNOSIS This condition is diagnosed with examination of the infected tooth. During the exam, your dentist may tap on the infected tooth. Your dentist will also ask about your medical and dental history and may order X-rays. TREATMENT This condition is treated by eliminating the infection. This may be done with:  Antibiotic medicine.  A root canal. This may be performed to save the tooth.  Pulling (extracting) the tooth. This may also involve draining the abscess. This is done if the tooth cannot be saved. HOME CARE INSTRUCTIONS  Take medicines only as directed by your dentist.  If you were prescribed antibiotic medicine, finish all of it even if you start to feel better.  Rinse your mouth (gargle) often with salt water to relieve pain or swelling.  Do not drive or operate heavy machinery while taking pain medicine.  Do not apply heat to the outside of your mouth.  Keep all follow-up visits as directed by your dentist. This is important. SEEK MEDICAL CARE IF:  Your pain is  worse and is not helped by medicine. SEEK IMMEDIATE MEDICAL CARE IF:  You have a fever or chills.  Your symptoms suddenly get worse.  You have a very bad headache.  You have problems breathing or swallowing.  You have trouble opening your mouth.  You have swelling in your neck or around your eye.   This information is not intended to replace advice given to you by your health care provider. Make sure you discuss any questions you have with your health care provider.   Document Released: 10/10/2005 Document Revised: 02/24/2015 Document Reviewed: 10/07/2014 Elsevier Interactive Patient Education [employer]2016 [employer]

## 2018-01-25 ENCOUNTER — Emergency Department (HOSPITAL_BASED_OUTPATIENT_CLINIC_OR_DEPARTMENT_OTHER)
Admission: EM | Admit: 2018-01-25 | Discharge: 2018-01-25 | Disposition: A | Discharge status: Home / Self Care | Payer: Self-pay | Attending: Physician Assistant | Admitting: Physician Assistant

## 2018-01-25 ENCOUNTER — Other Ambulatory Visit: Payer: Self-pay

## 2018-01-25 ENCOUNTER — Encounter (HOSPITAL_BASED_OUTPATIENT_CLINIC_OR_DEPARTMENT_OTHER): Payer: Self-pay | Admitting: *Deleted

## 2018-01-25 DIAGNOSIS — L02511 Cutaneous abscess of right hand: Secondary | ICD-10-CM | POA: Insufficient documentation

## 2018-01-25 DIAGNOSIS — Z79899 Other long term (current) drug therapy: Secondary | ICD-10-CM | POA: Insufficient documentation

## 2018-01-25 DIAGNOSIS — F17210 Nicotine dependence, cigarettes, uncomplicated: Secondary | ICD-10-CM | POA: Insufficient documentation

## 2018-01-25 MED ORDER — LIDOCAINE HCL (PF) 1 % IJ SOLN
5.0000 mL | Freq: Once | INTRAMUSCULAR | Status: AC
  Administered 2018-01-25: 5 mL via INTRADERMAL
  Filled 2018-01-25: qty 5

## 2018-01-25 MED ORDER — DOXYCYCLINE HYCLATE 100 MG PO TABS
100.0000 mg | ORAL_TABLET | Freq: Once | ORAL | Status: AC
  Administered 2018-01-25: 100 mg via ORAL
  Filled 2018-01-25: qty 1

## 2018-01-25 MED ORDER — CLINDAMYCIN HCL 150 MG PO CAPS: 300.0000 mg | ORAL_CAPSULE | Freq: Four times a day (QID) | ORAL | 0 refills | Status: AC

## 2018-01-25 MED ORDER — OXYCODONE-ACETAMINOPHEN 5-325 MG PO TABS
1.0000 | ORAL_TABLET | Freq: Once | ORAL | Status: AC
  Administered 2018-01-25: 1 via ORAL
  Filled 2018-01-25: qty 1

## 2018-01-25 MED FILL — CLINDAMYCIN HCL 150 MG CAPS: 150 | 7 days supply | Qty: 56 | Fill #0

## 2018-01-25 NOTE — Discharge Instructions (Signed)
Please use the antibiotics.  Please use warm soaks several times a day.  If worsening at all return immediately to the emergency department as you may require surgery or IV antibiotics.

## 2018-01-25 NOTE — ED Notes (Signed)
I&D tray at bedside.

## 2018-01-25 NOTE — ED Notes (Signed)
ED Provider at bedside. 

## 2018-01-25 NOTE — ED Provider Notes (Signed)
[facility] EMERGENCY DEPARTMENT Provider Note   CSN: [id number] Arrival date & time: 01/25/18  1318     History   Chief Complaint Chief Complaint  Patient presents with  . Finger Injury    HPI Caroline Cohen is a 27 y.o. female.  HPI   Patient is a 27 year old female presenting with index finger swelling and redness.  Patient reports that she was making Roseary and pulled to strongly and had a little cut there.  Had been doing okay, 2 weeks ago and then recently has been getting more erythematous swollen and painful.  Patient notes she squeeze a little bit of fluid out yesterday.  Past Medical History:  Diagnosis Date  . Seizures (HCC)     There are no active problems to display for this patient.   Past Surgical History:  Procedure Laterality Date  . APPENDECTOMY    . FEMUR FRACTURE SURGERY    . INNER EAR SURGERY       OB History   None      Home Medications    Prior to Admission medications   Medication Sig Start Date End Date Taking? Authorizing Provider  amoxicillin (AMOXIL) 500 MG capsule Take 1 capsule (500 mg total) by mouth 3 (three) times daily. 06/19/15   Hess, Nada Boozer, PA-C  clindamycin (CLEOCIN) 150 MG capsule Take 2 capsules (300 mg total) by mouth 3 (three) times daily. May dispense as 150mg  capsules 09/09/15   Garlon Hatchet, PA-C  lidocaine (XYLOCAINE) 2 % solution Apply topically as needed for pain. 09/09/15   Garlon Hatchet, PA-C  naproxen (NAPROSYN) 500 MG tablet Take 1 tablet (500 mg total) by mouth 2 (two) times daily with a meal. 09/09/15   Garlon Hatchet, PA-C    Family History No family history on file.  Social History Social History   Tobacco Use  . Smoking status: Current Every Day Smoker    Packs/day: 0.50    Types: Cigarettes  . Smokeless tobacco: Never Used  Substance Use Topics  . Alcohol use: No  . Drug use: No     Allergies   Flexeril [cyclobenzaprine]   Review of Systems Review of Systems    Constitutional: Negative for activity change.  Respiratory: Negative for shortness of breath.   Cardiovascular: Negative for chest pain.  Gastrointestinal: Negative for abdominal pain.     Physical Exam Updated Vital Signs BP 106/73   Pulse 88   Temp 99 F (37.2 C) (Oral)   Resp 18   Ht 5\' 3"  (1.6 m)   Wt 59 kg (130 lb)   LMP 01/11/2018   SpO2 99%   BMI 23.03 kg/m   Physical Exam  Constitutional: She is oriented to person, place, and time. She appears well-developed and well-nourished.  HENT:  Head: Normocephalic and atraumatic.  Eyes: Conjunctivae are normal. Right eye exhibits no discharge.  Neck: Neck supple.  Cardiovascular: Normal rate.  No murmur heard. Pulmonary/Chest: Effort normal.  Musculoskeletal: She exhibits no edema or deformity.  Mild erythema to the index finger, with small area of fluctuance. No canaval signs except mild swelling.   Neurological: She is oriented to person, place, and time. No cranial nerve deficit.  Skin: Skin is warm and dry. No rash noted. She is not diaphoretic.  Psychiatric: She has a normal mood and affect. Her behavior is normal.  Nursing note and vitals reviewed.    ED Treatments / Results  Labs (all labs ordered are listed, but only abnormal  results are displayed) Labs Reviewed - No data to display  EKG None  Radiology No results found.  Procedures .Marland Kitchen.Incision and Drainage Date/Time: 01/25/2018 2:44 PM Performed by: Abelino DerrickMackuen, Derrisha Foos Lyn, MD Authorized by: Abelino DerrickMackuen, Brinda Focht Lyn, MD   Consent:    Consent obtained:  Verbal Location:    Type:  Abscess   Location:  Upper extremity   Upper extremity location:  Finger   Finger location:  R index finger Pre-procedure details:    Skin preparation:  Betadine Anesthesia (see MAR for exact dosages):    Anesthesia method:  Local infiltration   Local anesthetic:  Lidocaine 1% WITH epi Procedure type:    Complexity:  Simple Procedure details:    Needle aspiration: no      Incision types:  Stab incision   Incision depth:  Dermal   Scalpel blade:  10   Wound management:  Probed and deloculated   Drainage:  Purulent and bloody   Drainage amount:  Scant   Wound treatment:  Wound left open   Packing materials:  None Post-procedure details:    Patient tolerance of procedure:  Tolerated well, no immediate complications   (including critical care time)  Medications Ordered in ED Medications  lidocaine (PF) (XYLOCAINE) 1 % injection 5 mL (has no administration in time range)  doxycycline (VIBRA-TABS) tablet 100 mg (has no administration in time range)     Initial Impression / Assessment and Plan / ED Course  I have reviewed the triage vital signs and the nursing notes.  Pertinent labs & imaging results that were available during my care of the patient were reviewed by me and considered in my medical decision making (see chart for details).     Patient is a 27 year old female presenting with index finger swelling and redness.  Patient reports that she was making Roseary and pulled to strongly and had a little cut there.  Had been doing okay, 2 weeks ago and then recently has been getting more erythematous swollen and painful.  Patient notes she squeeze a little bit of fluid out yesterday.  I and D showed puss.   Patient requesting cheap antibiotic.  Was able to find goodRx coupon for Clinda.  Final Clinical Impressions(s) / ED Diagnoses   Final diagnoses:  None    ED Discharge Orders    None       Abelino DerrickMackuen, [place]ster Lyn, MD 01/25/18 1453

## 2018-01-25 NOTE — ED Triage Notes (Signed)
Injury to her right index finger.  2 weeks ago she was making a rosery and pulled the string that holds the beads. She initially had a laceration that now is swollen, hot and painful.

## 2019-10-27 ENCOUNTER — Emergency Department (HOSPITAL_BASED_OUTPATIENT_CLINIC_OR_DEPARTMENT_OTHER)
Admission: EM | Admit: 2019-10-27 | Discharge: 2019-10-27 | Discharge status: Absent without leave | Payer: Medicaid Other

## 2019-10-27 ENCOUNTER — Other Ambulatory Visit: Payer: Self-pay

## 2020-02-27 ENCOUNTER — Emergency Department (HOSPITAL_BASED_OUTPATIENT_CLINIC_OR_DEPARTMENT_OTHER)
Admission: EM | Admit: 2020-02-27 | Discharge: 2020-02-27 | Disposition: A | Discharge status: Home / Self Care | Payer: Medicaid Other | Attending: Emergency Medicine | Admitting: Emergency Medicine

## 2020-02-27 ENCOUNTER — Other Ambulatory Visit: Payer: Self-pay

## 2020-02-27 ENCOUNTER — Encounter (HOSPITAL_BASED_OUTPATIENT_CLINIC_OR_DEPARTMENT_OTHER): Payer: Self-pay

## 2020-02-27 ENCOUNTER — Emergency Department (HOSPITAL_BASED_OUTPATIENT_CLINIC_OR_DEPARTMENT_OTHER): Payer: Medicaid Other

## 2020-02-27 DIAGNOSIS — M549 Dorsalgia, unspecified: Secondary | ICD-10-CM | POA: Insufficient documentation

## 2020-02-27 DIAGNOSIS — N899 Noninflammatory disorder of vagina, unspecified: Secondary | ICD-10-CM | POA: Diagnosis not present

## 2020-02-27 DIAGNOSIS — R300 Dysuria: Secondary | ICD-10-CM | POA: Diagnosis not present

## 2020-02-27 DIAGNOSIS — R109 Unspecified abdominal pain: Secondary | ICD-10-CM

## 2020-02-27 DIAGNOSIS — R102 Pelvic and perineal pain: Secondary | ICD-10-CM | POA: Diagnosis not present

## 2020-02-27 DIAGNOSIS — Z3A11 11 weeks gestation of pregnancy: Secondary | ICD-10-CM | POA: Insufficient documentation

## 2020-02-27 DIAGNOSIS — O99891 Other specified diseases and conditions complicating pregnancy: Secondary | ICD-10-CM | POA: Insufficient documentation

## 2020-02-27 DIAGNOSIS — O26891 Other specified pregnancy related conditions, first trimester: Secondary | ICD-10-CM

## 2020-02-27 HISTORY — DX: Other psychoactive substance abuse, uncomplicated: F19.10

## 2020-02-27 HISTORY — DX: Unspecified ovarian cyst, unspecified side: N83.209

## 2020-02-27 LAB — WET PREP, GENITAL
Sperm: NONE SEEN
Trich, Wet Prep: NONE SEEN
Yeast Wet Prep HPF POC: NONE SEEN

## 2020-02-27 LAB — COMPREHENSIVE METABOLIC PANEL
ALT: 26 U/L (ref 0–44)
AST: 22 U/L (ref 15–41)
Albumin: 3.9 g/dL (ref 3.5–5.0)
Alkaline Phosphatase: 45 U/L (ref 38–126)
Anion gap: 9 (ref 5–15)
BUN: 5 mg/dL — ABNORMAL LOW (ref 6–20)
CO2: 27 mmol/L (ref 22–32)
Calcium: 9.8 mg/dL (ref 8.9–10.3)
Chloride: 104 mmol/L (ref 98–111)
Creatinine, Ser: 0.55 mg/dL (ref 0.44–1.00)
GFR calc Af Amer: >60 mL/min (ref >60)
GFR calc non Af Amer: >60 mL/min (ref >60)
Glucose, Bld: 60 mg/dL — ABNORMAL LOW (ref 70–99)
Potassium: 3.9 mmol/L (ref 3.5–5.1)
Sodium: 140 mmol/L (ref 135–145)
Total Bilirubin: 0.7 mg/dL (ref 0.3–1.2)
Total Protein: 7.3 g/dL (ref 6.5–8.1)

## 2020-02-27 LAB — URINALYSIS, ROUTINE W REFLEX MICROSCOPIC
Bilirubin Urine: NEGATIVE
Glucose, UA: NEGATIVE mg/dL
Hgb urine dipstick: NEGATIVE
Ketones, ur: NEGATIVE mg/dL
Nitrite: NEGATIVE
Protein, ur: NEGATIVE mg/dL
Specific Gravity, Urine: 1.02 (ref 1.005–1.030)
pH: 8 (ref 5.0–8.0)

## 2020-02-27 LAB — URINALYSIS, MICROSCOPIC (REFLEX)

## 2020-02-27 LAB — CBC
HCT: 39 % (ref 36.0–46.0)
Hemoglobin: 13.2 g/dL (ref 12.0–15.0)
MCH: 29.5 pg (ref 26.0–34.0)
MCHC: 33.8 g/dL (ref 30.0–36.0)
MCV: 87.2 fL (ref 80.0–100.0)
Platelets: 261 10*3/uL (ref 150–400)
RBC: 4.47 MIL/uL (ref 3.87–5.11)
RDW: 12.6 % (ref 11.5–15.5)
WBC: 6.7 10*3/uL (ref 4.0–10.5)
nRBC: 0 % (ref 0.0–0.2)

## 2020-02-27 LAB — HCG, QUANTITATIVE, PREGNANCY: hCG, Beta Chain, Quant, S: 79393 m[IU]/mL — ABNORMAL HIGH (ref <5)

## 2020-02-27 MED ORDER — LIDOCAINE HCL (PF) 1 % IJ SOLN
INTRAMUSCULAR | Status: AC
  Administered 2020-02-27: 5 mL
  Filled 2020-02-27: qty 5

## 2020-02-27 MED ORDER — AZITHROMYCIN 250 MG PO TABS
1000.0000 mg | ORAL_TABLET | Freq: Once | ORAL | Status: AC
  Administered 2020-02-27: 1000 mg via ORAL
  Filled 2020-02-27: qty 4

## 2020-02-27 MED ORDER — CEFTRIAXONE SODIUM 500 MG IJ SOLR
500.0000 mg | Freq: Once | INTRAMUSCULAR | Status: AC
  Administered 2020-02-27: 500 mg via INTRAMUSCULAR
  Filled 2020-02-27: qty 500

## 2020-02-27 MED ORDER — ACETAMINOPHEN 500 MG PO TABS
500.0000 mg | ORAL_TABLET | Freq: Once | ORAL | Status: AC
  Administered 2020-02-27: 500 mg via ORAL
  Filled 2020-02-27: qty 1

## 2020-02-27 NOTE — ED Provider Notes (Signed)
Care handoff received from [facility] PA-C at shift change please see previous provider note for full details.  Summary below.  "Caroline Cohen is a 29 y.o. female G1, P0 who is [redacted] weeks pregnant that presents emergency department today for abdominal pain and back pain. Differential diagnoses considered include PID, TOA, ectopic pregnancy, ovarian torsion, normal pregnancy pain, cervicitis, pyelonephritis.  Patient has had an appendectomy.  Patient unlikely to have preeclampsia, blood pressure is soft.    Initial interventions given Tylenol for pain.  Will treat for gonorrhea chlamydia today in the emergency department due to questionable STI exposure.  Will treat with 1 g of azithromycin since she is pregnant.  Labs demonstrated stable CBC and CMP.  Wet prep with some clue cells present, patient does not have BV on exam.  Patient is not complaining of BV.  Will not treat for this.  Urinalysis shows dirty urine.  Will order urine culture.  Upon reassessment patient is more comfortable.  Awaiting ultrasound.Marland Kitchen   Marland KitchenAt shift change care was transferred Caroline Cohen, [place]- Cwho will follow pending studies, re-evaluate, and determine disposition.   Is ultrasound normal patient needs to follow-up with OB." Physical Exam  BP (!) 99/57   Pulse 85   Temp 98.1 F (36.7 C) (Oral)   Resp 13   Ht 5\' 6"  (1.676 m)   Wt 63.2 kg   SpO2 98%   BMI 22.48 kg/m   Physical Exam  ED Course/Procedures   Clinical Course as of Feb 26 1929  Thu Feb 27, 2020  1759 Pending Feb 29, 2020, d/c to OBGYN.   [BM]    Clinical Course User Index [BM] [place], PA-C    Procedures  MDM  Bill Salinas OB:  IMPRESSION:  Single viable intrauterine pregnancy as above. No specific  abnormality is seen.   Ultrasound reassuring.  AVS which was completed by previous provider was given to patient.  Patient reports that she has an OB/GYN appointment scheduled for follow-up visit.  Patient is aware that pending GC  chlamydia test and will follow up on results on her MyChart account in the next 2-3 days. I reassessed patient and updated her on findings as above and she stated understanding.  She is requesting discharge.  She states understanding of precautions and to avoid sex for the next 2 weeks.  On reassessment patient is well-appearing no acute distress and reports she is feeling well.  Review of labs, quantitative hCG appears consistent with ultrasound.  No emergent electrolyte derangement.  CBC stable.  Urinalysis is currently in culture.  Wet prep showed clue cells however as patient is asymptomatic previous provider deferred treatment for now which is reasonable.  Patient received Rocephin and azithromycin for STI treatment.  Vital signs stable at discharge.  At this time there does not appear to be any evidence of an acute emergency medical condition and the patient appears stable for discharge with appropriate outpatient follow up. Diagnosis was discussed with patient who verbalizes understanding of care plan and is agreeable to discharge. I have discussed return precautions with patient  who verbalizes understanding. Patient encouraged to follow-up with their PCP and OBGYN. All questions answered.   Note: Portions of this report may have been transcribed using voice recognition software. Every effort was made to ensure accuracy; however, inadvertent computerized transcription errors may still be present.   [place] 02/27/20 1940    04/28/20, MD 02/28/20 1815

## 2020-02-27 NOTE — Discharge Instructions (Addendum)
You were seen today in the emergency department today for abdominal pain.  Your labs were normal.  You were treated for gonorrhea and chlamydia today.  Follow-up with your OBG YN and as soon as possible.  You can use Tylenol as needed as prescribed on the bottle for pain.  Come back to the emergency department if you start having any continuous vomiting, increasing abdominal pain, worsening symptoms, high fevers, vaginal bleeding.  Do not have intercourse for the next 10 days.  This is extremely important. Use guide for abdominal pain.

## 2020-02-27 NOTE — ED Provider Notes (Signed)
[facility] EMERGENCY DEPARTMENT Provider Note   CSN: [id number] Arrival date & time: 02/27/20  1521     History Chief Complaint  Patient presents with  . Abdominal Pain    [redacted] weeks pregnant    Caroline Cohen is a 29 y.o. female G1, P0 who is [redacted] weeks pregnant that presents emergency department today for abdominal pain and back pain.  Patient states that the abdominal pain is in her left suprapubic area.  She states states that the pain is constant and radiates to her back. patient states that she was supposed to have an appointment with her OB today, however missed that because of and scheduling error.  She states that she did have a miscarriage in June and is concerned that she is having one right now.  Patient denies any vaginal bleeding.  Patient denies any shortness of breath, fevers, chest pain, nausea, vomiting, decreased appetite, marijuana use, IV drug use, alcohol.  Patient was seen at [facility] couple months ago and was treated prophylactically for STIs without being tested for gonorrhea chlamydia.  She states that she did not pick up the rest of her doses for her chlamydia treatment.  She states that she returned to the same partner who is having dysuria who was not treated.  Patient is concerned about her baby and wants an ultrasound due to her missed appointment.  Patient states that she is having some vaginal discharge, with some dysuria.  Denies any hematuria or other urinary symptoms.  Patient does have history of appendectomy.  HPI     Past Medical History:  Diagnosis Date  . Drug abuse (HCC)   . Ovarian cyst   . Seizures (HCC)     There are no problems to display for this patient.   Past Surgical History:  Procedure Laterality Date  . APPENDECTOMY    . DILATION AND CURETTAGE OF UTERUS    . FEMUR FRACTURE SURGERY    . INNER EAR SURGERY    . OVARIAN CYST SURGERY       OB History    Gravida  1   Para      Term      Preterm      AB        Living        SAB      TAB      Ectopic      Multiple      Live Births              No family history on file.  Social History   Tobacco Use  . Smoking status: Former Smoker    Packs/day: 0.50    Types: Cigarettes  . Smokeless tobacco: Never Used  Substance Use Topics  . Alcohol use: No  . Drug use: No    Home Medications Prior to Admission medications   Medication Sig Start Date End Date Taking? Authorizing Provider  amoxicillin (AMOXIL) 500 MG capsule Take 1 capsule (500 mg total) by mouth 3 (three) times daily. 06/19/15   Hess, Nada Boozer, PA-C  clindamycin (CLEOCIN) 150 MG capsule Take 2 capsules (300 mg total) by mouth 3 (three) times daily. May dispense as 150mg  capsules 09/09/15   09/11/15, PA-C  lidocaine (XYLOCAINE) 2 % solution Apply topically as needed for pain. 09/09/15   09/11/15, PA-C  naproxen (NAPROSYN) 500 MG tablet Take 1 tablet (500 mg total) by mouth 2 (two) times daily with a meal. 09/09/15  Larene Pickett, PA-C    Allergies    Flexeril [cyclobenzaprine]  Review of Systems   Review of Systems  Constitutional: Negative for chills, diaphoresis, fatigue and fever.  HENT: Negative for congestion, sore throat and trouble swallowing.   Eyes: Negative for pain and visual disturbance.  Respiratory: Negative for cough, shortness of breath and wheezing.   Cardiovascular: Negative for chest pain, palpitations and leg swelling.  Gastrointestinal: Positive for abdominal pain (Left suprapubic tendernes ). Negative for abdominal distention, diarrhea, nausea and vomiting.  Genitourinary: Positive for pelvic pain and vaginal discharge. Negative for difficulty urinating.  Musculoskeletal: Positive for back pain. Negative for neck pain and neck stiffness.  Skin: Negative for pallor.  Neurological: Negative for dizziness, speech difficulty, weakness and headaches.  Psychiatric/Behavioral: Negative for confusion.    Physical Exam Updated  Vital Signs BP 98/60 (BP Location: Left Arm)   Pulse 81   Temp 97.9 F (36.6 C) (Oral)   Resp 13   Ht 5\' 6"  (1.676 m)   Wt 63.2 kg   SpO2 100%   BMI 22.48 kg/m   Physical Exam Exam conducted with a chaperone present.  Constitutional:      General: She is not in acute distress.    Appearance: Normal appearance. She is not ill-appearing, toxic-appearing or diaphoretic.  HENT:     Mouth/Throat:     Mouth: Mucous membranes are moist.     Pharynx: Oropharynx is clear.  Eyes:     General: No scleral icterus.    Extraocular Movements: Extraocular movements intact.     Pupils: Pupils are equal, round, and reactive to light.  Cardiovascular:     Rate and Rhythm: Normal rate and regular rhythm.     Pulses: Normal pulses.     Heart sounds: Normal heart sounds.  Pulmonary:     Effort: Pulmonary effort is normal. No respiratory distress.     Breath sounds: Normal breath sounds. No stridor. No wheezing, rhonchi or rales.  Chest:     Chest wall: No tenderness.  Abdominal:     General: Abdomen is flat. There is no distension.     Palpations: Abdomen is soft.     Tenderness: There is abdominal tenderness (Suprapubic pain, more on left side ). There is no right CVA tenderness, left CVA tenderness, guarding or rebound. Negative signs include Murphy's sign, Rovsing's sign and McBurney's sign.  Genitourinary:    Vagina: Normal. No vaginal discharge, erythema, tenderness or bleeding.     Cervix: No discharge or friability.     Adnexa: Right adnexa normal and left adnexa normal.     Comments: Positive for cervical motion tenderness. Cervix is closed without blood.  No adnexal tenderness.  Musculoskeletal:        General: No swelling or tenderness. Normal range of motion.     Cervical back: Normal range of motion and neck supple. No rigidity.     Right lower leg: No edema.     Left lower leg: No edema.  Skin:    General: Skin is warm and dry.     Capillary Refill: Capillary refill takes  less than 2 seconds.     Coloration: Skin is not pale.  Neurological:     General: No focal deficit present.     Mental Status: She is alert and oriented to person, place, and time.  Psychiatric:        Mood and Affect: Mood normal.        Behavior: Behavior normal.  ED Results / Procedures / Treatments   Labs (all labs ordered are listed, but only abnormal results are displayed) Labs Reviewed  WET PREP, GENITAL - Abnormal; Notable for the following components:      Result Value   Clue Cells Wet Prep HPF POC PRESENT (*)    WBC, Wet Prep HPF POC MODERATE (*)    All other components within normal limits  URINALYSIS, ROUTINE W REFLEX MICROSCOPIC - Abnormal; Notable for the following components:   APPearance CLOUDY (*)    Leukocytes,Ua TRACE (*)    All other components within normal limits  URINALYSIS, MICROSCOPIC (REFLEX) - Abnormal; Notable for the following components:   Bacteria, UA FEW (*)    All other components within normal limits  HCG, QUANTITATIVE, PREGNANCY - Abnormal; Notable for the following components:   hCG, Beta Chain, Quant, S 27,062 (*)    All other components within normal limits  COMPREHENSIVE METABOLIC PANEL - Abnormal; Notable for the following components:   Glucose, Bld 60 (*)    BUN 5 (*)    All other components within normal limits  URINE CULTURE  CBC  GC/CHLAMYDIA PROBE AMP ([place]) NOT AT [facility]    EKG None  Radiology US OB Limited  Result Date: 02/27/2020 CLINICAL DATA:  Abdomen pain EXAM: OBSTETRIC <14 WK ULTRASOUND TECHNIQUE: Transabdominal ultrasound was performed for evaluation of the gestation as well as the maternal uterus and adnexal regions. COMPARISON:  01/11/2020 FINDINGS: Intrauterine gestational sac: Single Yolk sac:  Visualized. Embryo:  Visualized. Cardiac Activity: Visualized. Heart Rate: 158 bpm CRL: 48.3 mm   11 w 4 d                  [place] EDC: 09/13/2020 Subchorionic hemorrhage:  None visualized. Maternal uterus/adnexae:  Ovaries are nonvisualized. No significant free fluid IMPRESSION: Single viable intrauterine pregnancy as above. No specific abnormality is seen. Electronically Signed   By: Jasmine Pang M.D.   On: 02/27/2020 19:15    Procedures Procedures (including critical care time)  Medications Ordered in ED Medications  acetaminophen (TYLENOL) tablet 500 mg (500 mg Oral Given 02/27/20 1807)  cefTRIAXone (ROCEPHIN) injection 500 mg (500 mg Intramuscular Given 02/27/20 1840)  azithromycin (ZITHROMAX) tablet 1,000 mg (1,000 mg Oral Given 02/27/20 1838)  lidocaine (PF) (XYLOCAINE) 1 % injection (5 mLs  Given 02/27/20 1842)    ED Course  I have reviewed the triage vital signs and the nursing notes.  Pertinent labs & imaging results that were available during my care of the patient were reviewed by me and considered in my medical decision making (see chart for details).  Clinical Course as of Feb 28 1640  Thu Feb 27, 2020  1759 Pending [place], d/c to OBGYN.   [BM]    Clinical Course User Index [BM] Elizabeth Palau   MDM Rules/Calculators/A&P                      Laysa Kimmey is a 29 y.o. female G1, P0 who is [redacted] weeks pregnant that presents emergency department today for abdominal pain and back pain.  Differential diagnoses considered include PID, TOA, ectopic pregnancy, ovarian torsion, normal pregnancy pain, cervicitis, pyelonephritis.  Patient has had an appendectomy.  Patient unlikely to have preeclampsia, blood pressure is soft.  Normal LFTS.  Initial interventions given Tylenol for pain.  Will treat for gonorrhea chlamydia today in the emergency department due to questionable STI exposure.  Will treat with 1 g of azithromycin  since she is pregnant.  Labs demonstrated stable CBC and CMP.  Wet prep with some clue cells present, patient does not have BV on exam.  Patient is not complaining of BV.  Will not treat for this.  Urinalysis shows dirty urine.  Will order urine culture.  Upon  reassessment patient is more comfortable.  Awaiting ultrasound..   At shift change care was transferred to [facility], [place]- Cwho will follow pending studies, re-evaluate, and determine disposition.    Is ultrasound normal patient needs to follow-up with OB.    Final Clinical Impression(s) / ED Diagnoses Final diagnoses:  Abdominal pain during pregnancy in first trimester    Rx / DC Orders ED Discharge Orders    None       Farrel Gordon, PA-C 02/28/20 1641    Milagros Loll, MD 03/01/20 1730

## 2020-02-27 NOTE — ED Triage Notes (Signed)
Pt c/o lower abd pain and lower back pain x 2 days-states she is [redacted] weeks pregnant-denies vaginal bleeding/discharge-states she missed appt at [facility] in [place] today-NAD-steady gait

## 2020-02-27 NOTE — ED Notes (Signed)
ED Provider at bedside. 

## 2020-02-28 LAB — URINE CULTURE: Culture: NO GROWTH

## 2020-02-28 LAB — GC/CHLAMYDIA PROBE AMP (~~LOC~~) NOT AT ARMC
Chlamydia: NEGATIVE
Comment: NEGATIVE
Comment: NORMAL
Neisseria Gonorrhea: NEGATIVE

## 2021-04-16 IMAGING — US US OB LIMITED
1 series · 10 of 10 positions shown · non-contrast
Comparison: 01/11/2020

CLINICAL DATA: Abdomen pain

EXAM:
OBSTETRIC <14 WK ULTRASOUND
TECHNIQUE: Transabdominal ultrasound was performed for evaluation of the
gestation as well as the maternal uterus and adnexal regions.

[Series 1: us ob limited · 10 of 10 slices shown]
[im 1/10]
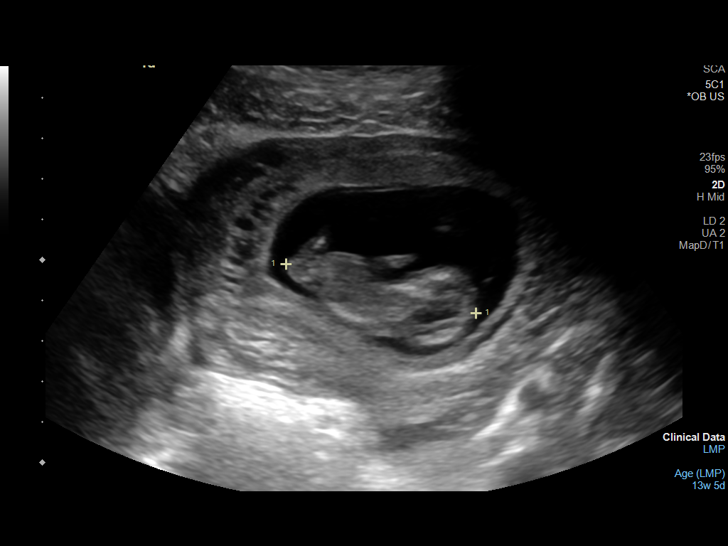
[im 2/10]
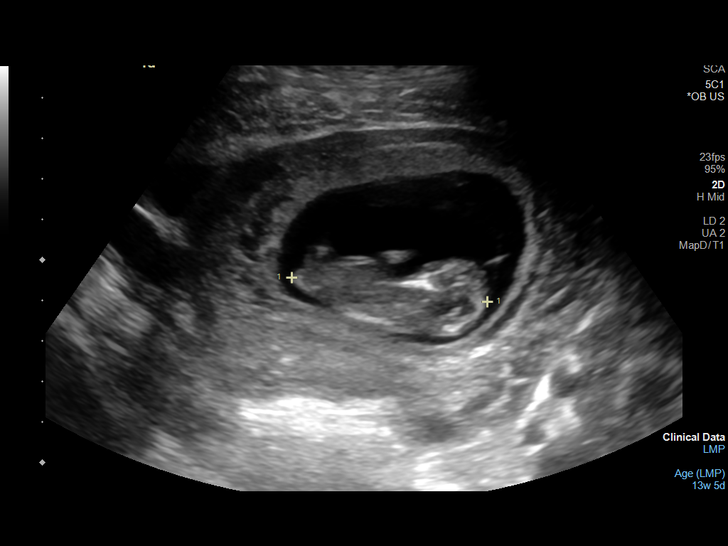
[im 3/10]
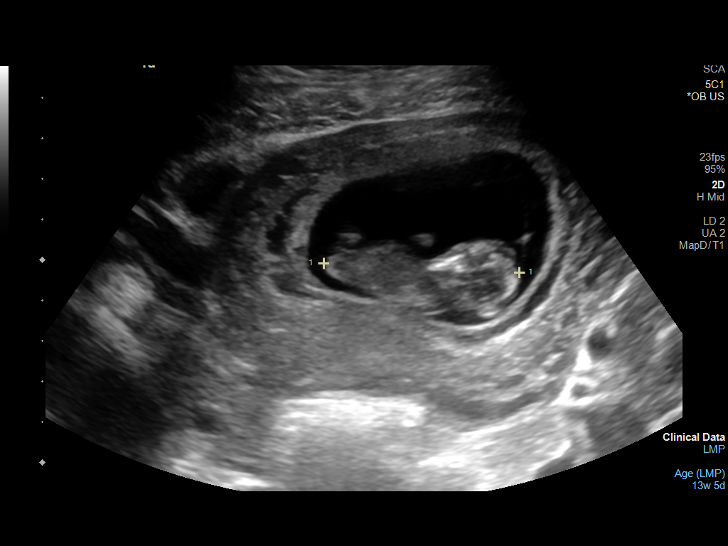
[im 4/10]
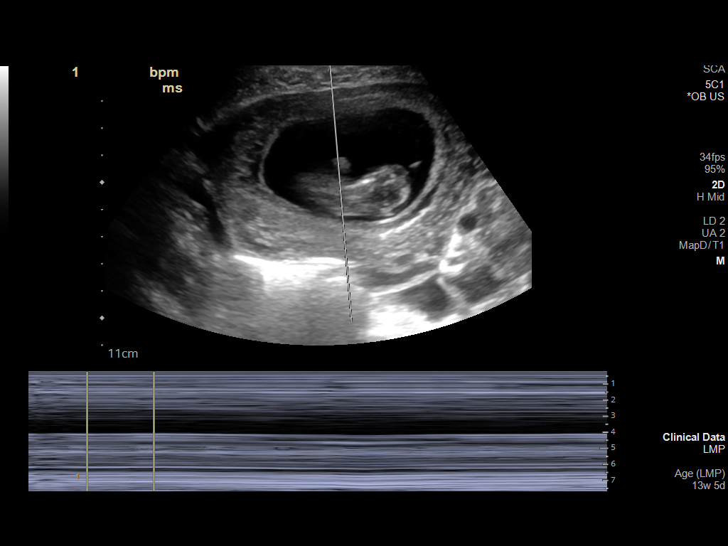
[im 5/10]
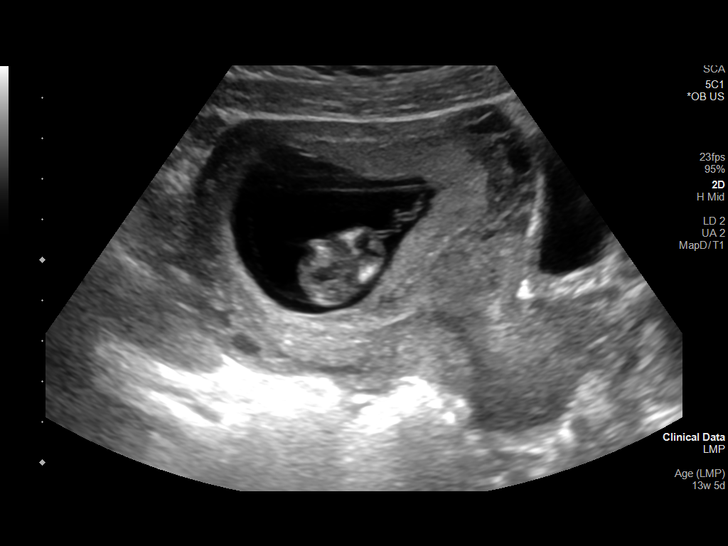
[im 6/10]
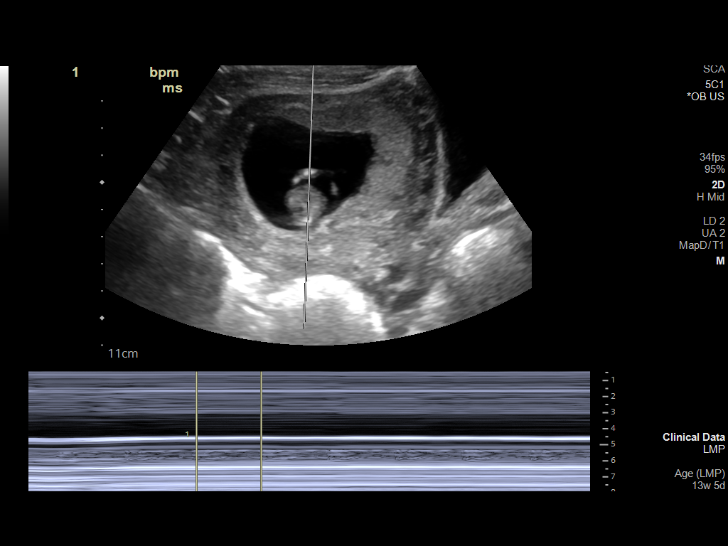
[im 7/10]
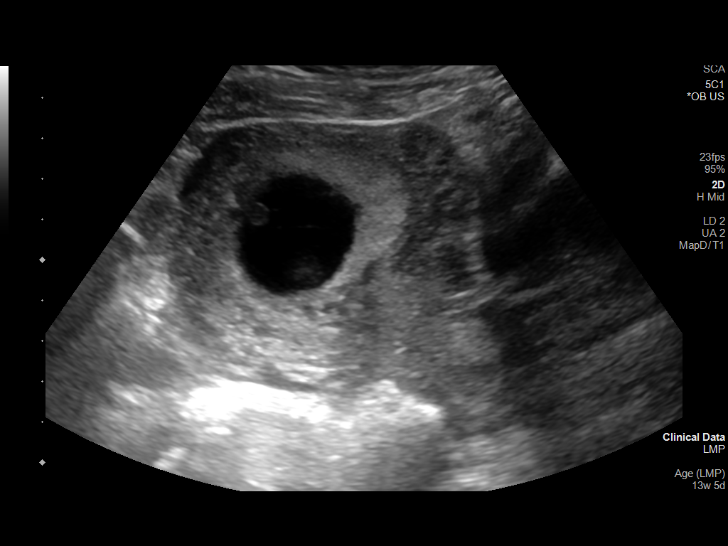
[im 8/10]
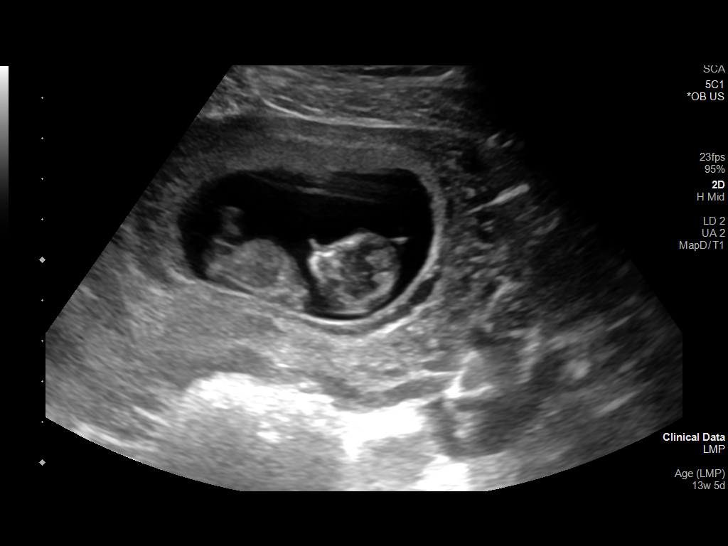
[im 9/10]
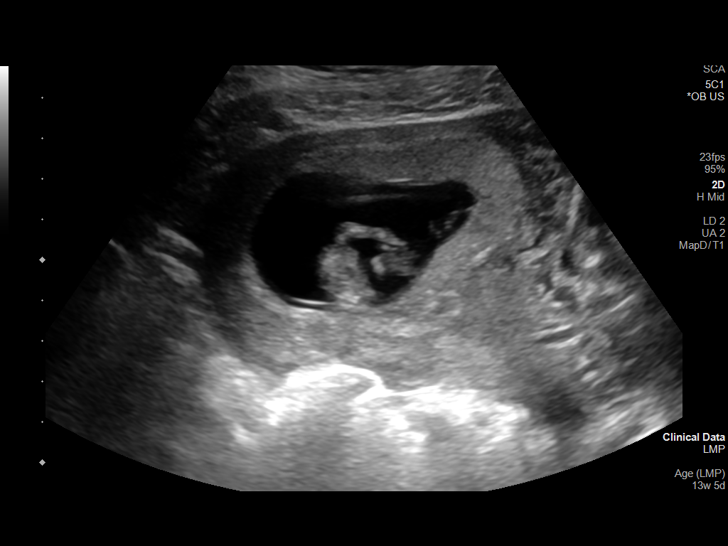
[im 10/10]
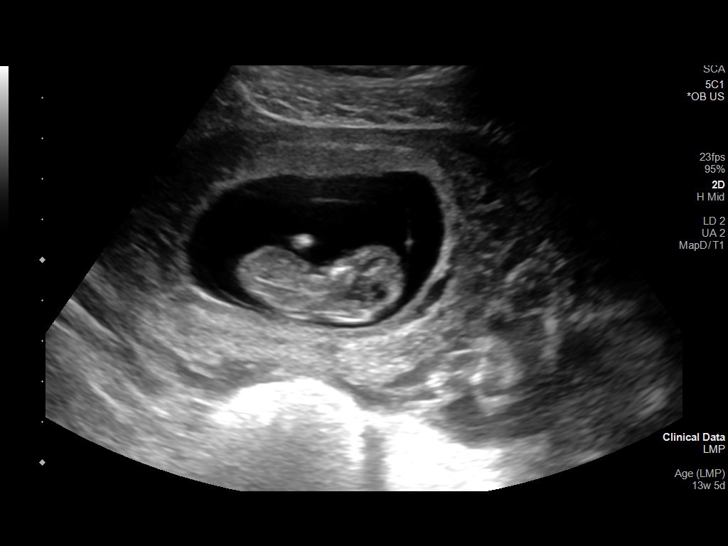

[10 of 10 positions shown; findings below may reference images not displayed]

FINDINGS: Intrauterine gestational sac: Single

Yolk sac:  Visualized.

Embryo:  Visualized.

Cardiac Activity: Visualized.

Heart Rate: 158 bpm

CRL: 48.3 mm   11 w 4 d                  US EDC: 09/13/2020

Subchorionic hemorrhage:  None visualized.

Maternal uterus/adnexae: Ovaries are nonvisualized. No significant
free fluid
IMPRESSION: Single viable intrauterine pregnancy as above. No specific
abnormality is seen.

## 2023-08-02 ENCOUNTER — Encounter (HOSPITAL_BASED_OUTPATIENT_CLINIC_OR_DEPARTMENT_OTHER): Payer: Self-pay | Admitting: Emergency Medicine

## 2023-08-02 ENCOUNTER — Emergency Department (HOSPITAL_BASED_OUTPATIENT_CLINIC_OR_DEPARTMENT_OTHER)
Admission: EM | Admit: 2023-08-02 | Discharge: 2023-08-02 | Disposition: A | Discharge status: Home / Self Care | Payer: No Typology Code available for payment source | Attending: Emergency Medicine | Admitting: Emergency Medicine

## 2023-08-02 ENCOUNTER — Other Ambulatory Visit: Payer: Self-pay

## 2023-08-02 DIAGNOSIS — R519 Headache, unspecified: Secondary | ICD-10-CM | POA: Diagnosis not present

## 2023-08-02 DIAGNOSIS — S161XXA Strain of muscle, fascia and tendon at neck level, initial encounter: Secondary | ICD-10-CM | POA: Diagnosis not present

## 2023-08-02 DIAGNOSIS — S199XXA Unspecified injury of neck, initial encounter: Secondary | ICD-10-CM | POA: Diagnosis present

## 2023-08-02 DIAGNOSIS — Y92410 Unspecified street and highway as the place of occurrence of the external cause: Secondary | ICD-10-CM | POA: Insufficient documentation

## 2023-08-02 MED ORDER — LIDOCAINE 5 % EX PTCH
1.0000 | MEDICATED_PATCH | CUTANEOUS | Status: DC
  Administered 2023-08-02: 1 via TRANSDERMAL
  Filled 2023-08-02: qty 1

## 2023-08-02 MED ORDER — ACETAMINOPHEN 500 MG PO TABS
1000.0000 mg | ORAL_TABLET | Freq: Once | ORAL | Status: AC
  Administered 2023-08-02: 1000 mg via ORAL
  Filled 2023-08-02: qty 2

## 2023-08-02 MED ORDER — METHOCARBAMOL 500 MG PO TABS: 500.0000 mg | ORAL_TABLET | Freq: Two times a day (BID) | ORAL | 0 refills | Status: DC
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
## 2024-05-27 ENCOUNTER — Encounter (HOSPITAL_BASED_OUTPATIENT_CLINIC_OR_DEPARTMENT_OTHER): Payer: Self-pay | Admitting: Emergency Medicine

## 2024-05-27 ENCOUNTER — Other Ambulatory Visit: Payer: Self-pay

## 2024-05-27 DIAGNOSIS — O209 Hemorrhage in early pregnancy, unspecified: Secondary | ICD-10-CM | POA: Insufficient documentation

## 2024-05-27 DIAGNOSIS — Z3A01 Less than 8 weeks gestation of pregnancy: Secondary | ICD-10-CM | POA: Diagnosis not present

## 2024-05-27 DIAGNOSIS — R109 Unspecified abdominal pain: Secondary | ICD-10-CM | POA: Insufficient documentation

## 2024-05-27 DIAGNOSIS — O26891 Other specified pregnancy related conditions, first trimester: Secondary | ICD-10-CM | POA: Diagnosis not present

## 2024-05-27 LAB — PREGNANCY, URINE: Preg Test, Ur: POSITIVE — AB

## 2024-05-27 NOTE — ED Triage Notes (Signed)
 Pt reports positive pregnancy test last week, started bleeding today, hx of previous miscarriage

## 2024-05-28 ENCOUNTER — Emergency Department (HOSPITAL_BASED_OUTPATIENT_CLINIC_OR_DEPARTMENT_OTHER)
Admission: EM | Admit: 2024-05-28 | Discharge: 2024-05-28 | Disposition: A | Discharge status: Home / Self Care | Attending: Emergency Medicine | Admitting: Emergency Medicine

## 2024-05-28 DIAGNOSIS — Z3A01 Less than 8 weeks gestation of pregnancy: Secondary | ICD-10-CM

## 2024-05-28 LAB — URINALYSIS, MICROSCOPIC (REFLEX)

## 2024-05-28 LAB — URINALYSIS, ROUTINE W REFLEX MICROSCOPIC
Bilirubin Urine: NEGATIVE
Glucose, UA: NEGATIVE mg/dL
Ketones, ur: NEGATIVE mg/dL
Nitrite: NEGATIVE
Protein, ur: NEGATIVE mg/dL
Specific Gravity, Urine: 1.02 (ref 1.005–1.030)
pH: 7.5 (ref 5.0–8.0)

## 2024-05-28 LAB — WET PREP, GENITAL
Sperm: NONE SEEN
Trich, Wet Prep: NONE SEEN
WBC, Wet Prep HPF POC: <10 (ref <10)
Yeast Wet Prep HPF POC: NONE SEEN

## 2024-05-28 LAB — CBC
HCT: 41.5 % (ref 36.0–46.0)
Hemoglobin: 13.4 g/dL (ref 12.0–15.0)
MCH: 27.5 pg (ref 26.0–34.0)
MCHC: 32.3 g/dL (ref 30.0–36.0)
MCV: 85 fL (ref 80.0–100.0)
Platelets: 284 K/uL (ref 150–400)
RBC: 4.88 MIL/uL (ref 3.87–5.11)
RDW: 13.2 % (ref 11.5–15.5)
WBC: 8.3 K/uL (ref 4.0–10.5)
nRBC: 0 % (ref 0.0–0.2)

## 2024-05-28 LAB — HCG, QUANTITATIVE, PREGNANCY: hCG, Beta Chain, Quant, S: 14183 m[IU]/mL — ABNORMAL HIGH (ref <5)

## 2024-05-28 NOTE — ED Notes (Signed)
 Quant re-drawn and sent to lab Also asked them to add urinalysis

## 2024-05-28 NOTE — ED Notes (Signed)
 Ed sec put in ticket for rm 2  Labels maker reads out of stock.  Printer is full of paper

## 2024-05-28 NOTE — Discharge Instructions (Addendum)
 Be sure to follow-up today with your OB/GYN. Tell them that you're pregnancy hormone was around 14,000

## 2024-05-28 NOTE — ED Provider Notes (Signed)
 [place] [facility] [place] Provider Note   CSN: [id number] Arrival date & time: 05/27/24  2212     Patient presents with: Vaginal Bleeding and Possible Pregnancy   Caroline Cohen is a 33 y.o. female.   The history is provided by the patient.  Possible Pregnancy Associated symptoms include abdominal pain.  Patient with history of substance use disorder presents for possible pregnancy bleeding Patient reports she started having abdominal cramping and episode of bleeding.  The bleeding has since resolved but she is not having back pain.  No fevers or vomiting.  No dysuria.  She reports this feels similar to previous miscarriage. LMP last month    Prior to Admission medications   Not on File    Allergies: Clindamycin /lincomycin and Flexeril [cyclobenzaprine]    Review of Systems  Gastrointestinal:  Positive for abdominal pain.  Genitourinary:  Positive for vaginal bleeding.    Updated Vital Signs BP 127/81 (BP Location: Left Arm)   Pulse (!) 108   Temp 97.7 F (36.5 C)   Resp 18   Ht 1.626 m (5' 4)   Wt 81.6 kg   LMP 04/23/2024 (Approximate)   SpO2 98%   BMI 30.90 kg/m   Physical Exam CONSTITUTIONAL: Well developed/well nourished HEAD: Normocephalic/atraumatic ENMT: Mucous membranes moist NECK: supple no meningeal signs CV: S1/S2 noted, no murmurs/rubs/gallops noted LUNGS: Lungs are clear to auscultation bilaterally, no apparent distress ABDOMEN: soft, nontender, no rebound or guarding, bowel sounds noted throughout abdomen GU:no cva tenderness Small amount of brownish discharge.  No active bleeding.  Os is closed.  No CMT.  Nurse present for exam NEURO: Pt is awake/alert/appropriate, moves all extremitiesx4.  No facial droop.   EXTREMITIES: pulses normal/equal, full ROM SKIN: warm, color normal PSYCH: no abnormalities of mood noted, alert and oriented to situation  (all labs ordered are listed, but only abnormal results are  displayed) Labs Reviewed  PREGNANCY, URINE - Abnormal; Notable for the following components:      Result Value   Preg Test, Ur POSITIVE (*)    All other components within normal limits  CBC  HCG, QUANTITATIVE, PREGNANCY  URINALYSIS, ROUTINE W REFLEX MICROSCOPIC    EKG: None  Radiology: No results found.   Ultrasound ED OB Pelvic  Date/Time: 05/28/2024 2:30 AM  Performed by: Midge Golas, MD Authorized by: Midge Golas, MD   Procedure details:    Indications: evaluate for IUP     Assess:  Intrauterine pregnancy   Technique:  Transabdominal obstetric (HCG+) exam   Images: archived   Study Limitations: patient compliance and body habitus Uterine findings:    Single gestation: identified     Gestational sac: identified      Other findings:    Free pelvic fluid: not identified     Free peritoneal fluid: not identified      Medications Ordered in the ED - No data to display  Clinical Course as of 05/28/24 0258  Tue May 28, 2024  0258 Patient has no active bleeding and minimal pain at this time. Patient reports she has an OB/GYN follow-up later today at 10 AM.  She is also expected to have an ultrasound. Given her overall appearance, and limited bedside ultrasound findings, patient safe for discharge home. I have low suspicion for ectopic pregnancy or other obstetric emergency [DW]  0258 We discussed strict ER return precautions [DW]    Clinical Course User Index [DW] Midge Golas, MD  Medical Decision Making Amount and/or Complexity of Data Reviewed Labs: ordered.   This patient presents to the ED for concern of abdominal pain and vaginal bleeding, this involves an extensive number of treatment options, and is a complaint that carries with it a high risk of complications and morbidity.  The differential diagnosis includes but is not limited to cholecystitis, cholelithiasis, pancreatitis, gastritis, peptic ulcer disease, ,  bowel obstruction, bowel perforation, ectopic pregnancy, PID, TOA   Comorbidities that complicate the patient evaluation: Patient's presentation is complicated by their history of substance use disorder  Social Determinants of Health: Patient's impaired access to primary care  increases the complexity of managing their presentation  Lab Tests: I Ordered, and personally interpreted labs.  The pertinent results include: Patient is pregnant  Test Considered: Will defer further workup given labs, bedside ultrasound and exam  Reevaluation: After the interventions noted above, I reevaluated the patient and found that they have :improved  Complexity of problems addressed: Patient's presentation is most consistent with  acute presentation with potential threat to life or bodily function  Disposition: After consideration of the diagnostic results and the patient's response to treatment,  I feel that the patent would benefit from discharge  .    Urine culture been sent. No convincing signs of BV GC chlamydia testing pending    Final diagnoses:  None    ED Discharge Orders     None          Midge Golas, MD 05/28/24 9698

## 2024-05-28 NOTE — ED Notes (Signed)
 Pt has a hx of previous miscarriage, and a baby that dies at 35 months old last year with a rare genetic condition.   Pt reports a small amt of bleeding at present time.  Cramping pain

## 2024-05-29 LAB — URINE CULTURE: Culture: <10000 — AB

## 2024-05-29 LAB — GC/CHLAMYDIA PROBE AMP (~~LOC~~) NOT AT ARMC
Chlamydia: NEGATIVE
Comment: NEGATIVE
Comment: NORMAL
Neisseria Gonorrhea: NEGATIVE
